# Patient Record
Sex: Female | Born: 1990 | Hispanic: Yes | Marital: Single | Smoking: Never smoker
Health system: Southern US, Community
[De-identification: ages and names within clinical notes are randomized; demographics above are authoritative.]

## PROBLEM LIST (undated history)

## (undated) DIAGNOSIS — E611 Iron deficiency: Secondary | ICD-10-CM

## (undated) DIAGNOSIS — J45909 Unspecified asthma, uncomplicated: Secondary | ICD-10-CM

## (undated) DIAGNOSIS — D689 Coagulation defect, unspecified: Secondary | ICD-10-CM

## (undated) HISTORY — DX: Iron deficiency: E61.1

## (undated) HISTORY — DX: Coagulation defect, unspecified: D68.9

## (undated) HISTORY — DX: Unspecified asthma, uncomplicated: J45.909

---

## 2012-04-18 ENCOUNTER — Emergency Department: Payer: Self-pay

## 2017-08-01 ENCOUNTER — Emergency Department
Admission: EM | Admit: 2017-08-01 | Discharge: 2017-08-02 | Disposition: A | Discharge status: Home / Self Care | Payer: BLUE CROSS/BLUE SHIELD | Attending: Emergency Medicine | Admitting: Emergency Medicine

## 2017-08-01 ENCOUNTER — Encounter: Payer: Self-pay | Admitting: Emergency Medicine

## 2017-08-01 ENCOUNTER — Emergency Department: Payer: BLUE CROSS/BLUE SHIELD

## 2017-08-01 DIAGNOSIS — R1031 Right lower quadrant pain: Secondary | ICD-10-CM

## 2017-08-01 LAB — CBC
HCT: 38.4 % (ref 35.0–47.0)
HEMOGLOBIN: 13.1 g/dL (ref 12.0–16.0)
MCH: 28.9 pg (ref 26.0–34.0)
MCHC: 34.1 g/dL (ref 32.0–36.0)
MCV: 84.8 fL (ref 80.0–100.0)
Platelets: 256 10*3/uL (ref 150–440)
RBC: 4.53 MIL/uL (ref 3.80–5.20)
RDW: 13.3 % (ref 11.5–14.5)
WBC: 7.9 10*3/uL (ref 3.6–11.0)

## 2017-08-01 LAB — COMPREHENSIVE METABOLIC PANEL
ALK PHOS: 50 U/L (ref 38–126)
ALT: 12 U/L — ABNORMAL LOW (ref 14–54)
AST: 20 U/L (ref 15–41)
Albumin: 4.1 g/dL (ref 3.5–5.0)
Anion gap: 7 (ref 5–15)
BUN: 13 mg/dL (ref 6–20)
CALCIUM: 8.9 mg/dL (ref 8.9–10.3)
CHLORIDE: 105 mmol/L (ref 101–111)
CO2: 22 mmol/L (ref 22–32)
Creatinine, Ser: 0.58 mg/dL (ref 0.44–1.00)
Glucose, Bld: 112 mg/dL — ABNORMAL HIGH (ref 65–99)
Potassium: 3.6 mmol/L (ref 3.5–5.1)
SODIUM: 134 mmol/L — AB (ref 135–145)
Total Bilirubin: 0.5 mg/dL (ref 0.3–1.2)
Total Protein: 7.3 g/dL (ref 6.5–8.1)

## 2017-08-01 LAB — URINALYSIS, COMPLETE (UACMP) WITH MICROSCOPIC
BILIRUBIN URINE: NEGATIVE
Bacteria, UA: NONE SEEN
GLUCOSE, UA: NEGATIVE mg/dL
HGB URINE DIPSTICK: NEGATIVE
KETONES UR: NEGATIVE mg/dL
LEUKOCYTES UA: NEGATIVE
Nitrite: NEGATIVE
PH: 6 (ref 5.0–8.0)
PROTEIN: NEGATIVE mg/dL
Specific Gravity, Urine: 1.019 (ref 1.005–1.030)

## 2017-08-01 LAB — POCT PREGNANCY, URINE: Preg Test, Ur: NEGATIVE

## 2017-08-01 LAB — LIPASE, BLOOD: LIPASE: 23 U/L (ref 11–51)

## 2017-08-01 NOTE — Discharge Instructions (Signed)
Fortunately today your blood work, your urine exam, and your ultrasound were all reassuring. Please make an appointment to follow-up with your primary care physician in 2 days for reexamination and return to the emergency department sooner for any new or worsening symptoms such as fevers, chills, worsening pain, or for any other concerns whatsoever.  It was a pleasure to take care of you today, and thank you for coming to our emergency department.  If you have any questions or concerns before leaving please ask the nurse to grab me and I'm more than happy to go through your aftercare instructions again.  If you were prescribed any opioid pain medication today such as Norco, Vicodin, Percocet, morphine, hydrocodone, or oxycodone please make sure you do not drive when you are taking this medication as it can alter your ability to drive safely.  If you have any concerns once you are home that you are not improving or are in fact getting worse before you can make it to your follow-up appointment, please do not hesitate to call 911 and come back for further evaluation.  Merrily Brittle, MD  Results for orders placed or performed during the hospital encounter of 08/01/17  Lipase, blood  Result Value Ref Range   Lipase 23 11 - 51 U/L  Comprehensive metabolic panel  Result Value Ref Range   Sodium 134 (L) 135 - 145 mmol/L   Potassium 3.6 3.5 - 5.1 mmol/L   Chloride 105 101 - 111 mmol/L   CO2 22 22 - 32 mmol/L   Glucose, Bld 112 (H) 65 - 99 mg/dL   BUN 13 6 - 20 mg/dL   Creatinine, Ser 4.09 0.44 - 1.00 mg/dL   Calcium 8.9 8.9 - 81.1 mg/dL   Total Protein 7.3 6.5 - 8.1 g/dL   Albumin 4.1 3.5 - 5.0 g/dL   AST 20 15 - 41 U/L   ALT 12 (L) 14 - 54 U/L   Alkaline Phosphatase 50 38 - 126 U/L   Total Bilirubin 0.5 0.3 - 1.2 mg/dL   GFR calc non Af Amer >60 >60 mL/min   GFR calc Af Amer >60 >60 mL/min   Anion gap 7 5 - 15  CBC  Result Value Ref Range   WBC 7.9 3.6 - 11.0 K/uL   RBC 4.53 3.80 - 5.20  MIL/uL   Hemoglobin 13.1 12.0 - 16.0 g/dL   HCT 91.4 78.2 - 95.6 %   MCV 84.8 80.0 - 100.0 fL   MCH 28.9 26.0 - 34.0 pg   MCHC 34.1 32.0 - 36.0 g/dL   RDW 21.3 08.6 - 57.8 %   Platelets 256 150 - 440 K/uL  Urinalysis, Complete w Microscopic  Result Value Ref Range   Color, Urine YELLOW (A) YELLOW   APPearance CLEAR (A) CLEAR   Specific Gravity, Urine 1.019 1.005 - 1.030   pH 6.0 5.0 - 8.0   Glucose, UA NEGATIVE NEGATIVE mg/dL   Hgb urine dipstick NEGATIVE NEGATIVE   Bilirubin Urine NEGATIVE NEGATIVE   Ketones, ur NEGATIVE NEGATIVE mg/dL   Protein, ur NEGATIVE NEGATIVE mg/dL   Nitrite NEGATIVE NEGATIVE   Leukocytes, UA NEGATIVE NEGATIVE   RBC / HPF 0-5 0 - 5 RBC/hpf   WBC, UA 0-5 0 - 5 WBC/hpf   Bacteria, UA NONE SEEN NONE SEEN   Squamous Epithelial / LPF 0-5 (A) NONE SEEN   Mucus PRESENT   Pregnancy, urine POC  Result Value Ref Range   Preg Test, Ur NEGATIVE NEGATIVE  US Transvaginal Non-ob  Result Date: 08/01/2017 CLINICAL DATA:  Right lower quadrant pain for 1 month EXAM: TRANSABDOMINAL AND TRANSVAGINAL ULTRASOUND OF PELVIS DOPPLER ULTRASOUND OF OVARIES TECHNIQUE: Both transabdominal and transvaginal ultrasound examinations of the pelvis were performed. Transabdominal technique was performed for global imaging of the pelvis including uterus, ovaries, adnexal regions, and pelvic cul-de-sac. It was necessary to proceed with endovaginal exam following the transabdominal exam to visualize the uterus, endometrium, ovaries and adnexa . Color and duplex Doppler ultrasound was utilized to evaluate blood flow to the ovaries. COMPARISON:  None. FINDINGS: Uterus Measurements: 6.6 x 3.5 x 4.7 cm. No fibroids or other mass visualized. Endometrium Thickness: 5 mm in thickness.  No focal abnormality visualized. Right ovary Measurements: 3.4 x 2.0 x 2.3 cm. Normal appearance/no adnexal mass. Left ovary Measurements: 3.0 x 1.8 x 2.2 cm. Normal appearance/no adnexal mass. Pulsed Doppler evaluation  of both ovaries demonstrates normal low-resistance arterial and venous waveforms. Other findings Small amount of free fluid in the pelvis. IMPRESSION: Unremarkable pelvic ultrasound. Electronically Signed   By: Charlett Nose M.D.   On: 08/01/2017 20:28   US Pelvis Complete  Result Date: 08/01/2017 CLINICAL DATA:  Right lower quadrant pain for 1 month EXAM: TRANSABDOMINAL AND TRANSVAGINAL ULTRASOUND OF PELVIS DOPPLER ULTRASOUND OF OVARIES TECHNIQUE: Both transabdominal and transvaginal ultrasound examinations of the pelvis were performed. Transabdominal technique was performed for global imaging of the pelvis including uterus, ovaries, adnexal regions, and pelvic cul-de-sac. It was necessary to proceed with endovaginal exam following the transabdominal exam to visualize the uterus, endometrium, ovaries and adnexa . Color and duplex Doppler ultrasound was utilized to evaluate blood flow to the ovaries. COMPARISON:  None. FINDINGS: Uterus Measurements: 6.6 x 3.5 x 4.7 cm. No fibroids or other mass visualized. Endometrium Thickness: 5 mm in thickness.  No focal abnormality visualized. Right ovary Measurements: 3.4 x 2.0 x 2.3 cm. Normal appearance/no adnexal mass. Left ovary Measurements: 3.0 x 1.8 x 2.2 cm. Normal appearance/no adnexal mass. Pulsed Doppler evaluation of both ovaries demonstrates normal low-resistance arterial and venous waveforms. Other findings Small amount of free fluid in the pelvis. IMPRESSION: Unremarkable pelvic ultrasound. Electronically Signed   By: Charlett Nose M.D.   On: 08/01/2017 20:28   [place] Art/ven Flow Abd Pelv Doppler  Result Date: 08/01/2017 CLINICAL DATA:  Right lower quadrant pain for 1 month EXAM: TRANSABDOMINAL AND TRANSVAGINAL ULTRASOUND OF PELVIS DOPPLER ULTRASOUND OF OVARIES TECHNIQUE: Both transabdominal and transvaginal ultrasound examinations of the pelvis were performed. Transabdominal technique was performed for global imaging of the pelvis including uterus, ovaries,  adnexal regions, and pelvic cul-de-sac. It was necessary to proceed with endovaginal exam following the transabdominal exam to visualize the uterus, endometrium, ovaries and adnexa . Color and duplex Doppler ultrasound was utilized to evaluate blood flow to the ovaries. COMPARISON:  None. FINDINGS: Uterus Measurements: 6.6 x 3.5 x 4.7 cm. No fibroids or other mass visualized. Endometrium Thickness: 5 mm in thickness.  No focal abnormality visualized. Right ovary Measurements: 3.4 x 2.0 x 2.3 cm. Normal appearance/no adnexal mass. Left ovary Measurements: 3.0 x 1.8 x 2.2 cm. Normal appearance/no adnexal mass. Pulsed Doppler evaluation of both ovaries demonstrates normal low-resistance arterial and venous waveforms. Other findings Small amount of free fluid in the pelvis. IMPRESSION: Unremarkable pelvic ultrasound. Electronically Signed   By: Charlett Nose M.D.   On: 08/01/2017 20:28

## 2017-08-01 NOTE — ED Triage Notes (Signed)
C/O RLQ and right flank pain x 3 weeks.  Seen by PCP for same about 2 weeks ago.

## 2017-08-01 NOTE — ED Provider Notes (Signed)
[facility] Emergency Department Provider Note  ____________________________________________   First MD Initiated Contact with Patient 08/01/17 1839     (approximate)  I have reviewed the triage vital signs and the nursing notes.   HISTORY  Chief Complaint Abdominal Pain    HPI Gabrielle Parrish is a 26 y.o. female who self presents to the emergency department with 3 weeks of intermittent right flank and right lower quadrant pain. The pain is moderate severity and today has been constant ever since 11 AM. 2 weeks ago she saw her primary care physician who felt the symptoms may be related to her period. Her last menstrual period was 8 days ago and has since resolved. She said no fevers or chills. No history of abdominal surgery. One week ago she had sexual intercourse and it was not particularly painful. She has noted no vaginal discharge or foul vaginal odor. She's had no nausea or vomiting.   History reviewed. No pertinent past medical history.  There are no active problems to display for this patient.   History reviewed. No pertinent surgical history.  Prior to Admission medications   Not on File    Allergies Patient has no known allergies.  No family history on file.  Social History Social History  Substance Use Topics  . Smoking status: Never Smoker  . Smokeless tobacco: Never Used  . Alcohol use Yes     Comment: occ    Review of Systems Constitutional: No fever/chills Eyes: No visual changes. ENT: No sore throat. Cardiovascular: Denies chest pain. Respiratory: Denies shortness of breath. Gastrointestinal: Positive abdominal pain.  No nausea, no vomiting.  No diarrhea.  No constipation. Genitourinary: Negative for dysuria. Musculoskeletal: Negative for back pain. Skin: Negative for rash. Neurological: Negative for headaches, focal weakness or numbness.   ____________________________________________   PHYSICAL EXAM:  VITAL  SIGNS: ED Triage Vitals  Enc Vitals Group     BP 08/01/17 1706 107/68     Pulse Rate 08/01/17 1706 69     Resp 08/01/17 1706 16     Temp 08/01/17 1706 97.7 F (36.5 C)     Temp Source 08/01/17 1706 Oral     SpO2 08/01/17 1706 99 %     Weight 08/01/17 1704 135 lb (61.2 kg)     Height 08/01/17 1704 5' (1.524 m)     Head Circumference --      Peak Flow --      Pain Score 08/01/17 1704 5     Pain Loc --      Pain Edu? --      Excl. in GC? --     Constitutional: Alert and oriented 4 well appearing nontoxic no diaphoresis but full clear sentences Eyes: PERRL EOMI. Head: Atraumatic. Nose: No congestion/rhinnorhea. Mouth/Throat: No trismus Neck: No stridor.   Cardiovascular: Normal rate, regular rhythm. Grossly normal heart sounds.  Good peripheral circulation. Respiratory: Normal respiratory effort.  No retractions. Lungs CTAB and moving good air Gastrointestinal: Soft nondistended nontender no rebound or guarding no peritonitis no McBurney's tenderness negative Rovsing's no costovertebral tenderness Musculoskeletal: No lower extremity edema   Neurologic:  Normal speech and language. No gross focal neurologic deficits are appreciated. Skin:  Skin is warm, dry and intact. No rash noted. Psychiatric: Mood and affect are normal. Speech and behavior are normal.    ____________________________________________   DIFFERENTIAL includes but not limited to  Pelvic inflammatory disease, urinary tract infection, pyelonephritis, appendicitis, diverticulitis ____________________________________________   LABS (all labs ordered are  listed, but only abnormal results are displayed)  Labs Reviewed  COMPREHENSIVE METABOLIC PANEL - Abnormal; Notable for the following:       Result Value   Sodium 134 (*)    Glucose, Bld 112 (*)    ALT 12 (*)    All other components within normal limits  URINALYSIS, COMPLETE (UACMP) WITH MICROSCOPIC - Abnormal; Notable for the following:    Color, Urine  YELLOW (*)    APPearance CLEAR (*)    Squamous Epithelial / LPF 0-5 (*)    All other components within normal limits  WET PREP, GENITAL  CHLAMYDIA/NGC RT PCR ([facility] ONLY)  LIPASE, BLOOD  CBC  POC URINE PREG, ED  POCT PREGNANCY, URINE    No signs of urinary tract infection __________________________________________  EKG   ____________________________________________  RADIOLOGY  Pelvic ultrasound normal ____________________________________________   PROCEDURES  Procedure(s) performed: no  Procedures  Critical Care performed: no  Observation: no ____________________________________________   INITIAL IMPRESSION / ASSESSMENT AND PLAN / ED COURSE  Pertinent labs & imaging results that were available during my care of the patient were reviewed by me and considered in my medical decision making (see chart for details).  The patient is having 3 weeks of right-sided abdominal pain. Her abdomen is benign in this duration of symptoms is not consistent with appendicitis. She has no hematuria and this is not consistent with renal colic. Pelvic ultrasound obtained and she has no evidence of tubo-ovarian abscess etc. She defers pelvic exam at this time so we will check GC CT in her urine. At this point her abdomen is benign and her vitals are stable and she is able to eat and drink. She is medically stable for outpatient management.      ____________________________________________   FINAL CLINICAL IMPRESSION(S) / ED DIAGNOSES  Final diagnoses:  Right lower quadrant abdominal pain      NEW MEDICATIONS STARTED DURING THIS VISIT:  New Prescriptions   No medications on file     Note:  This document was prepared using Dragon voice recognition software and may include unintentional dictation errors.     Merrily Brittle, MD 08/02/17 Perlie Mayo

## 2017-08-02 LAB — CHLAMYDIA/NGC RT PCR (ARMC ONLY)
Chlamydia Tr: NOT DETECTED
N gonorrhoeae: NOT DETECTED

## 2017-11-23 ENCOUNTER — Ambulatory Visit
Admission: EM | Admit: 2017-11-23 | Discharge: 2017-11-23 | Disposition: A | Discharge status: Home / Self Care | Payer: BLUE CROSS/BLUE SHIELD

## 2017-11-23 ENCOUNTER — Encounter: Payer: Self-pay | Admitting: *Deleted

## 2017-11-23 DIAGNOSIS — R600 Localized edema: Secondary | ICD-10-CM | POA: Diagnosis not present

## 2017-11-23 DIAGNOSIS — M79605 Pain in left leg: Secondary | ICD-10-CM

## 2017-11-23 NOTE — Discharge Instructions (Signed)
-  recommend you go to the ER for US evaluation for a blood clot in your leg. It is best to have this evaluated as soon as possible because clots in the leg can break off and travel through your heart and cause heart/respiratory problems or stroke.

## 2017-11-23 NOTE — ED Provider Notes (Signed)
[facility]    CSN: [id number] Arrival date & time: 11/23/17  1842     History   Chief Complaint Chief Complaint  Patient presents with  . Leg Pain    HPI Gabrielle Parrish is a 26 y.o. female.   Patient is a [age] show female who presents with complaint of swelling to her left leg. Patient's couple weeks ago she was sitting on the floor of the dog with her left foot tucked up against her right thigh, leaving her left hip flexed out. States that she had pain for a day or so after that that pain resolved. The point that she just cannot pulled something sitting on the floor. She states that her pain and discomfort returned this past Tuesday and that she elevated her leg last night. This morning, she noticed that it was swelling. She reports feeling little tingly states that with her legs dangling while sitting on the exam table, her lower leg feels heavy. She has been able to walk on it. She has not had any recent travels but does take a oral birth control pill daily. She denies any chest pain or shortness of breath.     History reviewed. No pertinent past medical history.  There are no active problems to display for this patient.   History reviewed. No pertinent surgical history.  OB History    No data available       Home Medications    Prior to Admission medications   Medication Sig Start Date End Date Taking? Authorizing Provider  naproxen (NAPROSYN) 500 MG tablet Take 500 mg by mouth 2 (two) times daily with a meal.   Yes [provider]  Norgestim-Eth Estrad Triphasic (TRI-SPRINTEC PO) Take by mouth.   Yes [provider]  traMADol (ULTRAM) 50 MG tablet Take by mouth every 6 (six) hours as needed.   Yes [provider]    Family History Family History  Problem Relation Age of Onset  . Healthy Mother   . Healthy Father     Social History Social History   Tobacco Use  . Smoking status: Never Smoker  . Smokeless tobacco:  Never Used  Substance Use Topics  . Alcohol use: Yes    Comment: occ  . Drug use: No     Allergies   Patient has no known allergies.   Review of Systems Review of Systems  As noted above in history of present illness. Other systems reviewed and found to be negative.   Physical Exam Triage Vital Signs ED Triage Vitals  Enc Vitals Group     BP 11/23/17 1858 113/72     Pulse Rate 11/23/17 1858 98     Resp 11/23/17 1858 16     Temp 11/23/17 1858 98.3 F (36.8 C)     Temp Source 11/23/17 1858 Oral     SpO2 11/23/17 1858 99 %     Weight 11/23/17 1900 135 lb (61.2 kg)     Height 11/23/17 1900 5\' 8"  (1.727 m)     Head Circumference --      Peak Flow --      Pain Score 11/23/17 1901 7     Pain Loc --      Pain Edu? --      Excl. in GC? --    No data found.  Updated Vital Signs BP 113/72 (BP Location: Left Arm)   Pulse 98   Temp 98.3 F (36.8 C) (Oral)   Resp 16  Ht 5\' 8"  (1.727 m)   Wt 135 lb (61.2 kg)   LMP 11/17/2017 (Exact Date)   SpO2 99%   BMI 20.53 kg/m    Physical Exam  Constitutional: She appears well-developed and well-nourished.  Eyes: EOM are normal.  Neck: Normal range of motion.  Cardiovascular: Normal rate, regular rhythm and normal heart sounds.  Pulmonary/Chest: Effort normal and breath sounds normal. No respiratory distress.  Musculoskeletal: Normal range of motion.  Left lower extremity with increased edema compared to the right., Both in overall feels tightness compared to the right plus pitting edema noted to the left shin and ankle. Positive Homans sign on the left.  Skin: Skin is warm and dry.     UC Treatments / Results  Labs (all labs ordered are listed, but only abnormal results are displayed) Labs Reviewed - No data to display  EKG  EKG Interpretation None       Radiology No results found.  Procedures Procedures (including critical care time)  Medications Ordered in UC Medications - No data to display   Initial  Impression / Assessment and Plan / UC Course  I have reviewed the triage vital signs and the nursing notes.  Pertinent labs & imaging results that were available during my care of the patient were reviewed by me and considered in my medical decision making (see chart for details).    Patient with increased swelling to the left leg compared to the right inguinal for a couple days now. Both increased tightness as well as edema noted to the leg. Positive Homans sign on the left.  Final Clinical Impressions(s) / UC Diagnoses   Final diagnoses:  Leg edema, left    ED Discharge Orders    None     Concern for a DVT on the left given her oral contraceptive use. 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 Recommended she go to the ER to have further evaluation with an ultrasound as renal have that capability here at this time. She is in agreement. Her questions were answered and she states that she will go on to have exam done.    Controlled Substance Prescriptions [place] Controlled Substance Registry consulted? Not Applicable   Candis SchatzHarris, Michael D, PA-C 11/23/17 [id number]

## 2017-11-23 NOTE — ED Triage Notes (Signed)
2 weeks ago pt was playing with her dog on the floor and had her left leg in a position extended laterally. When she got up she began to have pain in her left medial thigh. Pain has been intermittent for the past 2 weeks. Left leg became edematous and painful today. Pain is worse with weight bearing.

## 2017-11-24 MED ORDER — TRAMADOL HCL 50 MG PO TABS
50.00 mg | ORAL_TABLET | ORAL | Status: DC
Start: ? — End: 2017-11-24

## 2017-11-24 MED ORDER — ACETAMINOPHEN 325 MG PO TABS
650.00 mg | ORAL_TABLET | ORAL | Status: DC
Start: ? — End: 2017-11-24

## 2017-11-24 MED ORDER — RIVAROXABAN 15 MG PO TABS
15.00 mg | ORAL_TABLET | ORAL | Status: DC
Start: 2017-11-24 — End: 2017-11-24

## 2017-12-07 ENCOUNTER — Other Ambulatory Visit (INDEPENDENT_AMBULATORY_CARE_PROVIDER_SITE_OTHER): Payer: Self-pay

## 2017-12-07 ENCOUNTER — Ambulatory Visit (INDEPENDENT_AMBULATORY_CARE_PROVIDER_SITE_OTHER): Payer: BLUE CROSS/BLUE SHIELD | Admitting: Vascular Surgery

## 2017-12-07 ENCOUNTER — Encounter (INDEPENDENT_AMBULATORY_CARE_PROVIDER_SITE_OTHER): Payer: Self-pay | Admitting: Vascular Surgery

## 2017-12-07 DIAGNOSIS — J45909 Unspecified asthma, uncomplicated: Secondary | ICD-10-CM

## 2017-12-07 DIAGNOSIS — I2699 Other pulmonary embolism without acute cor pulmonale: Secondary | ICD-10-CM

## 2017-12-07 DIAGNOSIS — I82422 Acute embolism and thrombosis of left iliac vein: Secondary | ICD-10-CM

## 2017-12-07 DIAGNOSIS — J45998 Other asthma: Secondary | ICD-10-CM

## 2017-12-07 MED ORDER — CEFAZOLIN SODIUM-DEXTROSE 1-4 GM/50ML-% IV SOLN
1.0000 g | Freq: Once | INTRAVENOUS | Status: AC
  Administered 2017-12-08: 1 g via INTRAVENOUS

## 2017-12-08 ENCOUNTER — Encounter
Admission: RE | Disposition: A | Discharge status: Home / Self Care | Payer: Self-pay | Source: Ambulatory Visit | Attending: Vascular Surgery

## 2017-12-08 ENCOUNTER — Other Ambulatory Visit: Payer: Self-pay

## 2017-12-08 ENCOUNTER — Encounter (INDEPENDENT_AMBULATORY_CARE_PROVIDER_SITE_OTHER): Payer: Self-pay | Admitting: Vascular Surgery

## 2017-12-08 ENCOUNTER — Observation Stay
Admission: RE | Admit: 2017-12-08 | Discharge: 2017-12-09 | Disposition: A | Discharge status: Home / Self Care | Payer: BLUE CROSS/BLUE SHIELD | Source: Ambulatory Visit | Attending: Vascular Surgery | Admitting: Vascular Surgery

## 2017-12-08 DIAGNOSIS — I2699 Other pulmonary embolism without acute cor pulmonale: Secondary | ICD-10-CM | POA: Insufficient documentation

## 2017-12-08 DIAGNOSIS — J45998 Other asthma: Secondary | ICD-10-CM | POA: Insufficient documentation

## 2017-12-08 DIAGNOSIS — I82422 Acute embolism and thrombosis of left iliac vein: Principal | ICD-10-CM | POA: Insufficient documentation

## 2017-12-08 DIAGNOSIS — I82402 Acute embolism and thrombosis of unspecified deep veins of left lower extremity: Secondary | ICD-10-CM | POA: Diagnosis not present

## 2017-12-08 DIAGNOSIS — Z79899 Other long term (current) drug therapy: Secondary | ICD-10-CM | POA: Insufficient documentation

## 2017-12-08 DIAGNOSIS — J45909 Unspecified asthma, uncomplicated: Secondary | ICD-10-CM | POA: Insufficient documentation

## 2017-12-08 DIAGNOSIS — I82409 Acute embolism and thrombosis of unspecified deep veins of unspecified lower extremity: Secondary | ICD-10-CM | POA: Insufficient documentation

## 2017-12-08 HISTORY — PX: IVC FILTER INSERTION: CATH118245

## 2017-12-08 HISTORY — PX: PERIPHERAL VASCULAR THROMBECTOMY: CATH118306

## 2017-12-08 LAB — PREGNANCY, URINE: PREG TEST UR: NEGATIVE

## 2017-12-08 LAB — BASIC METABOLIC PANEL
Anion gap: 9 (ref 5–15)
BUN: 10 mg/dL (ref 6–20)
CHLORIDE: 107 mmol/L (ref 101–111)
CO2: 23 mmol/L (ref 22–32)
CREATININE: 0.57 mg/dL (ref 0.44–1.00)
Calcium: 9 mg/dL (ref 8.9–10.3)
Glucose, Bld: 90 mg/dL (ref 65–99)
POTASSIUM: 4.1 mmol/L (ref 3.5–5.1)
SODIUM: 139 mmol/L (ref 135–145)

## 2017-12-08 LAB — HEPARIN LEVEL (UNFRACTIONATED): HEPARIN UNFRACTIONATED: 0.29 [IU]/mL — AB (ref 0.30–0.70)

## 2017-12-08 LAB — APTT: APTT: 44 s — AB (ref 24–36)

## 2017-12-08 SURGERY — PERIPHERAL VASCULAR THROMBECTOMY
Anesthesia: Moderate Sedation | Laterality: Left

## 2017-12-08 MED ORDER — MORPHINE SULFATE (PF) 4 MG/ML IV SOLN
2.0000 mg | INTRAVENOUS | Status: DC | PRN
  Administered 2017-12-09: 2 mg via INTRAVENOUS
  Filled 2017-12-08 (×2): qty 1

## 2017-12-08 MED ORDER — LIDOCAINE HCL (PF) 1 % IJ SOLN
INTRAMUSCULAR | Status: AC
  Filled 2017-12-08: qty 30

## 2017-12-08 MED ORDER — FENTANYL CITRATE (PF) 100 MCG/2ML IJ SOLN
INTRAMUSCULAR | Status: DC | PRN
  Administered 2017-12-08 (×6): 25 ug via INTRAVENOUS
  Administered 2017-12-08: 50 ug via INTRAVENOUS
  Administered 2017-12-08 (×3): 25 ug via INTRAVENOUS

## 2017-12-08 MED ORDER — HEPARIN SODIUM (PORCINE) 1000 UNIT/ML IJ SOLN
INTRAMUSCULAR | Status: AC
  Filled 2017-12-08: qty 1

## 2017-12-08 MED ORDER — OXYCODONE HCL 5 MG PO TABS
ORAL_TABLET | ORAL | Status: AC
  Filled 2017-12-08: qty 1

## 2017-12-08 MED ORDER — MIDAZOLAM HCL 2 MG/2ML IJ SOLN
INTRAMUSCULAR | Status: DC | PRN
  Administered 2017-12-08: 0.5 mg via INTRAVENOUS
  Administered 2017-12-08: 1 mg via INTRAVENOUS
  Administered 2017-12-08 (×3): 0.5 mg via INTRAVENOUS
  Administered 2017-12-08: 2 mg via INTRAVENOUS
  Administered 2017-12-08 (×3): 0.5 mg via INTRAVENOUS

## 2017-12-08 MED ORDER — MIDAZOLAM HCL 5 MG/5ML IJ SOLN
INTRAMUSCULAR | Status: AC
  Filled 2017-12-08: qty 5

## 2017-12-08 MED ORDER — ALBUTEROL SULFATE (2.5 MG/3ML) 0.083% IN NEBU: 2.5000 mg | INHALATION_SOLUTION | Freq: Four times a day (QID) | RESPIRATORY_TRACT | Status: DC | PRN

## 2017-12-08 MED ORDER — ALTEPLASE 2 MG IJ SOLR
INTRAMUSCULAR | Status: DC | PRN
  Administered 2017-12-08: 12 mg

## 2017-12-08 MED ORDER — HEPARIN (PORCINE) IN NACL 100-0.45 UNIT/ML-% IJ SOLN
650.0000 [IU]/h | INTRAMUSCULAR | Status: AC
  Administered 2017-12-08: 650 [IU]/h via INTRAVENOUS

## 2017-12-08 MED ORDER — SODIUM CHLORIDE 0.9% FLUSH: 3.0000 mL | INTRAVENOUS | Status: DC | PRN

## 2017-12-08 MED ORDER — SODIUM CHLORIDE 0.9% FLUSH
3.0000 mL | Freq: Two times a day (BID) | INTRAVENOUS | Status: DC
  Administered 2017-12-09: 3 mL via INTRAVENOUS

## 2017-12-08 MED ORDER — MIDAZOLAM HCL 5 MG/5ML IJ SOLN
INTRAMUSCULAR | Status: AC
Start: 2017-12-08 — End: 2017-12-08
  Filled 2017-12-08: qty 5

## 2017-12-08 MED ORDER — IOPAMIDOL (ISOVUE-300) INJECTION 61%
INTRAVENOUS | Status: DC | PRN
  Administered 2017-12-08: 60 mL via INTRAVENOUS

## 2017-12-08 MED ORDER — ALTEPLASE 2 MG IJ SOLR
INTRAMUSCULAR | Status: AC
  Filled 2017-12-08: qty 12

## 2017-12-08 MED ORDER — OXYCODONE HCL 5 MG PO TABS
5.0000 mg | ORAL_TABLET | ORAL | Status: DC | PRN
  Administered 2017-12-08: 5 mg via ORAL

## 2017-12-08 MED ORDER — FENTANYL CITRATE (PF) 100 MCG/2ML IJ SOLN
INTRAMUSCULAR | Status: AC
  Filled 2017-12-08: qty 2

## 2017-12-08 MED ORDER — HEPARIN SODIUM (PORCINE) 1000 UNIT/ML IJ SOLN
INTRAMUSCULAR | Status: DC | PRN
  Administered 2017-12-08: 3000 [IU] via INTRAVENOUS
  Administered 2017-12-08: 2000 [IU] via INTRAVENOUS

## 2017-12-08 MED ORDER — ACETAMINOPHEN 500 MG PO TABS: 250.0000 mg | ORAL_TABLET | Freq: Four times a day (QID) | ORAL | Status: DC | PRN

## 2017-12-08 MED ORDER — ONDANSETRON HCL 4 MG/2ML IJ SOLN
4.0000 mg | Freq: Four times a day (QID) | INTRAMUSCULAR | Status: DC | PRN
  Administered 2017-12-08: 4 mg via INTRAVENOUS
  Filled 2017-12-08: qty 2

## 2017-12-08 MED ORDER — SODIUM CHLORIDE 0.9 % IV SOLN
INTRAVENOUS | Status: DC
  Administered 2017-12-08: 12:00:00 via INTRAVENOUS

## 2017-12-08 MED ORDER — HEPARIN (PORCINE) IN NACL 2-0.9 UNIT/ML-% IJ SOLN
INTRAMUSCULAR | Status: AC
  Filled 2017-12-08: qty 1000

## 2017-12-08 MED ORDER — HEPARIN (PORCINE) IN NACL 100-0.45 UNIT/ML-% IJ SOLN
INTRAMUSCULAR | Status: AC
  Administered 2017-12-08: 650 [IU]/h via INTRAVENOUS
  Filled 2017-12-08: qty 250

## 2017-12-08 MED ORDER — SODIUM CHLORIDE 0.9 % IV SOLN: 250.0000 mL | INTRAVENOUS | Status: DC | PRN

## 2017-12-08 MED ORDER — RIVAROXABAN 15 MG PO TABS
15.0000 mg | ORAL_TABLET | Freq: Two times a day (BID) | ORAL | Status: DC
  Administered 2017-12-09: 15 mg via ORAL
  Filled 2017-12-08 (×2): qty 1

## 2017-12-08 MED ORDER — SODIUM CHLORIDE 0.9 % IV SOLN
INTRAVENOUS | Status: AC
  Administered 2017-12-08: 16:00:00 via INTRAVENOUS

## 2017-12-08 MED ORDER — FENTANYL CITRATE (PF) 100 MCG/2ML IJ SOLN
INTRAMUSCULAR | Status: AC
Start: 2017-12-08 — End: 2017-12-08
  Filled 2017-12-08: qty 2

## 2017-12-08 SURGICAL SUPPLY — 30 items
BALLN ARMADA 14X60X80 (BALLOONS) ×3
BALLN DORADO 10X80X80 (BALLOONS) ×3
BALLN DORADO 8X60X80 (BALLOONS) ×3
BALLN LUTONIX AV 12X40X75 (BALLOONS) ×6
BALLN ULTRVRSE 10X40X75 (BALLOONS) ×3
BALLOON ARMADA 14X60X80 (BALLOONS) ×1 IMPLANT
BALLOON DORADO 10X80X80 (BALLOONS) ×1 IMPLANT
BALLOON DORADO 8X60X80 (BALLOONS) ×1 IMPLANT
BALLOON LUTONIX AV 12X40X75 (BALLOONS) ×2 IMPLANT
BALLOON ULTRVRSE 10X40X75 (BALLOONS) ×1 IMPLANT
CANISTER PENUMBRA MAX (MISCELLANEOUS) ×3 IMPLANT
CATH BEACON 5 .035 65 KMP TIP (CATHETERS) ×3 IMPLANT
CATH INDIGO 8 TORQ TIP 85CM (CATHETERS) ×3 IMPLANT
CATH INDIGO SEP 8 (CATHETERS) ×3 IMPLANT
DEVICE PRESTO INFLATION (MISCELLANEOUS) ×3 IMPLANT
DEVICE TORQUE (MISCELLANEOUS) ×3 IMPLANT
FILTER VC CELECT-FEMORAL (Filter) ×3 IMPLANT
GUIDEWIRE ANGLED .035 180CM (WIRE) ×3 IMPLANT
GUIDEWIRE SUPER STIFF .035X180 (WIRE) ×3 IMPLANT
KIT FEMORAL DEL DENALI (Miscellaneous) IMPLANT
NEEDLE ENTRY 21GA 7CM ECHOTIP (NEEDLE) ×3 IMPLANT
PACK ANGIOGRAPHY (CUSTOM PROCEDURE TRAY) ×6 IMPLANT
SET INTRO CAPELLA COAXIAL (SET/KITS/TRAYS/PACK) ×3 IMPLANT
SHEATH BRITE TIP 8FRX11 (SHEATH) ×3 IMPLANT
SHIELD X-DRAPE GOLD 12X17 (MISCELLANEOUS) ×3 IMPLANT
STENT LIFESTAR 14X40 (Permanent Stent) ×6 IMPLANT
TUBING ASPIRATION INDIGO (MISCELLANEOUS) ×3 IMPLANT
WIRE J 3MM .035X145CM (WIRE) ×3 IMPLANT
WIRE MAGIC TOR.035 180C (WIRE) ×3 IMPLANT
WIRE SPARTACORE .014X190CM (WIRE) ×3 IMPLANT

## 2017-12-08 NOTE — Progress Notes (Signed)
MRN : [id number]  Gabrielle Parrish is a 27 y.o. (04-15-1991) female who presents with chief complaint of  Chief Complaint  Patient presents with  . New Patient (Initial Visit)    Left leg swelling and pain  .  History of Present Illness: The patient is seen for second opinion.  On November 24, 2017 she was seen at [facility] emergency room and noted to have iliofemoral DVT as well as pulmonary emboli.  At that time she was started on Xarelto and discharged.  Apparently, she was not offered thrombolysis at that time.    Since that time she has noted improvement in her shortness of breath and her pleuritic type chest pains have gone away.  However, her left leg continues to be a significant problem.  She states it continues to be very painful.  She has to walk with a limp because of the swelling and pain.  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  She does note that when she is able to elevate her left leg does feel significantly better.  She also notes that in the morning her leg is better.  She has been faithfully taking her Xarelto.  As a added factor she was given a prescription for a new oral contraceptive but has not filled that and recently had unprotected sex.  She is asking whether a Plan B pill is appropriate given her Xarelto and her DVT.  Current Meds  Medication Sig  . Rivaroxaban (XARELTO) 15 MG TABS tablet Take 15 mg by mouth 2 (two) times daily with a meal.   . [DISCONTINUED] albuterol (PROAIR HFA) 108 (90 Base) MCG/ACT inhaler INHALE 2 PUFF EVERY 4 - 6 HOURS AS NEEDED  . [DISCONTINUED] naproxen (NAPROSYN) 500 MG tablet Take 500 mg by mouth 2 (two) times daily with a meal.  . [DISCONTINUED] norethindrone (MICRONOR,CAMILA,ERRIN) 0.35 MG tablet Take 1 tablet by mouth daily.    Past Medical History:  Diagnosis Date  . Asthma     History reviewed. No pertinent surgical history.  Social  History Social History   Tobacco Use  . Smoking status: Never Smoker  . Smokeless tobacco: Never Used  Substance Use Topics  . Alcohol use: Yes    Comment: occ  . Drug use: No    Family History Family History  Problem Relation Age of Onset  . Healthy Mother   . Healthy Father   No family history of bleeding/clotting disorders, porphyria or autoimmune disease   No Known Allergies   REVIEW OF SYSTEMS (Negative unless checked)  Constitutional: [] Weight loss  [] Fever  [] Chills Cardiac: [] Chest pain   [] Chest pressure   [] Palpitations   [] Shortness of breath when laying flat   [] Shortness of breath with exertion. Vascular:  [] Pain in legs with walking   [x] Pain in legs at rest  [x] History of DVT   [x] Phlebitis   [] Swelling in legs   [] Varicose veins   [] Non-healing ulcers Pulmonary:   [] Uses home oxygen   [] Productive cough   [] Hemoptysis   [] Wheeze  [] COPD   [] Asthma Neurologic:  [] Dizziness   [] Seizures   [] History of stroke   [] History of TIA  [] Aphasia   [] Vissual changes   [] Weakness or numbness in arm   [] Weakness or numbness in leg Musculoskeletal:   [] Joint swelling   [] Joint pain   [] Low back pain Hematologic:  [] Easy bruising  [] Easy bleeding   [] Hypercoagulable state   []   Anemic Gastrointestinal:  [] Diarrhea   [] Vomiting  [] Gastroesophageal reflux/heartburn   [] Difficulty swallowing. Genitourinary:  [] Chronic kidney disease   [] Difficult urination  [] Frequent urination   [] Blood in urine Skin:  [] Rashes   [] Ulcers  Psychological:  [] History of anxiety   []  History of major depression.  Physical Examination  Vitals:   12/07/17 1137  BP: 98/62  Pulse: 66  Resp: 15  Weight: 138 lb (62.6 kg)  Height: 5' (1.524 m)   Body mass index is 26.95 kg/m. Gen: WD/WN, NAD Head: [place]/AT, No temporalis wasting.  Ear/Nose/Throat: Hearing grossly intact, nares w/o erythema or drainage, poor dentition Eyes: PER, EOMI, sclera nonicteric.  Neck: Supple, no masses.  No bruit or JVD.   Pulmonary:  Good air movement, clear to auscultation bilaterally, no use of accessory muscles.  Cardiac: RRR, normal S1, S2, no Murmurs. Vascular: Left leg is severely swollen and congested. Vessel Right Left  Radial Palpable Palpable  PT Palpable Palpable  DP Palpable Palpable  Gastrointestinal: soft, non-distended. No guarding/no peritoneal signs.  Musculoskeletal: M/S 5/5 throughout.  No deformity or atrophy.  Neurologic: CN 2-12 intact. Pain and light touch intact in extremities.  Symmetrical.  Speech is fluent. Motor exam as listed above. Psychiatric: Judgment intact, Mood & affect appropriate for pt's clinical situation. Dermatologic: No rashes or ulcers noted.  No changes consistent with cellulitis. Lymph : No Cervical lymphadenopathy, no lichenification or skin changes of chronic lymphedema.  CBC Lab Results  Component Value Date   WBC 7.9 08/01/2017   HGB 13.1 08/01/2017   HCT 38.4 08/01/2017   MCV 84.8 08/01/2017   PLT 256 08/01/2017    BMET    Component Value Date/Time   NA 134 (L) 08/01/2017 1705   K 3.6 08/01/2017 1705   CL 105 08/01/2017 1705   CO2 22 08/01/2017 1705   GLUCOSE 112 (H) 08/01/2017 1705   BUN 13 08/01/2017 1705   CREATININE 0.58 08/01/2017 1705   CALCIUM 8.9 08/01/2017 1705   GFRNONAA >60 08/01/2017 1705   GFRAA >60 08/01/2017 1705   CrCl cannot be calculated (Patient's most recent lab result is older than the maximum 21 days allowed.).  COAG No results found for: INR, PROTIME  Radiology No results found.   Assessment/Plan 1. Acute deep vein thrombosis (DVT) of iliac vein of left lower extremity (HCC) Recommend:   Given the patient's significant disability and complaints about lifestyle limitation her difficulties at work I have recommended thrombolysis.  The timeframe was discussed with the patient.  It has been several weeks but I believe that given we are not past the 3-week window the procedure can still be successful.  I have  described to May Thurner to her and the possibility of stenting of the left iliac vein.  I described thrombolysis.  I have also reviewed the insertion of a IVC filter prior to beginning the thrombolysis and have already discussed with her that this filter must be removed in approximately 4-6 weeks.  The risks and benefits were reviewed in detail alternative therapies have been discussed extensively all questions been answered.  Initially the patient wished to discuss this with family.  She called back later in the day and requested that we move forward.  Intervention will be arranged for tomorrow Friday, December 08, 2017.  With respect to her concern regarding the Plan B pill with I have contacted Dr. Jean Rosenthal and reviewed this situation.  Given that she is continuing her Xarelto there should not be any complicating factors with  taking the pill.  We also agreed that given her new comorbidities establishing with an OB/GYN would be in her best interest and she is agreed to see Dr. Jean RosenthalJackson as an outpatient.  I will place a referral. Patient's duplex ultrasound of the venous system shows DVT from the popliteal to the femoral veins.  Elevation was stressed, use of a recliner was discussed.  I have had a long discussion with the patient regarding DVT and post phlebitic changes such as swelling and why it  causes symptoms such as pain.  The patient will wear graduated compression stockings class 1 (20-30 mmHg), beginning after three full days of anticoagulation, on a daily basis a prescription was given. The patient will  beginning wearing the stockings first thing in the morning and removing them in the evening. The patient is instructed specifically not to sleep in the stockings.  In addition, behavioral modification including elevation during the day and avoidance of prolonged dependency will be initiated.    The patient will continue anticoagulation for now as there have not been any problems or complications  at this point.    A total of 70 minutes was spent with this patient and greater than 50% was spent in counseling and coordination of care with the patient.  Discussion included the treatment options for vascular disease including indications for surgery and intervention.  Also discussed is the appropriate timing of treatment.  In addition medical therapy was discussed.   2. Other acute pulmonary embolism without acute cor pulmonale (HCC) See above  3. Mild asthma, unspecified whether complicated, unspecified whether persistent Continue pulmonary medications and aerosols as already ordered, these medications have been reviewed and there are no changes at this time.      Levora DredgeGregory Era Parr, MD  12/08/2017 8:06 AM

## 2017-12-08 NOTE — Op Note (Signed)
VEIN AND VASCULAR SURGERY                                                                               OPERATIVE NOTE    PRE-OPERATIVE DIAGNOSIS: Symptomatic left leg DVT; pulmonary embolism;   POST-OPERATIVE DIAGNOSIS: Same; May Thurner syndrome  PROCEDURE: 1. Ultrasound guidance for vascular access to the left popliteal vein 2. Catheter placement into the inferior vena cava for placement of IVC filter 3. Inferior venacavogram 4. Placement of a Celect IVC filter infrarenal 5. Introduction catheter into venous system second order catheter placement left popliteal approach 6. Infusion thrombolysis with 12 mg of TPA 7. Mechanical thrombectomy of the left common femoral, external iliac and common iliac vein using the penumbra cat 8 catheter 8.    Percutaneous transluminal angioplasty and stent placement left common and external iliac veins 9.    Percutaneous transluminal angioplasty left common femoral vein  SURGEON: Hortencia Pilar  ASSISTANT(S): None  ANESTHESIA: Conscious sedation was administered by the interventional radiology RN under my direct supervision. IV Versed plus fentanyl were utilized. Continuous ECG, pulse oximetry and blood pressure was monitored throughout the entire procedure.  Conscious sedation was administered for a total of 138 minutes.  ESTIMATED BLOOD LOSS: minimal  Contrast:60  Fluoroscopy time:17.5  FINDING(S): 1. Patent IVC; thrombus within the left common femoral external iliac and common iliac veins, stricture of the proximal external iliac vein and distal common iliac vein  SPECIMEN(S): none  INDICATIONS:  Gabrielle Parrish is a 27 y.o. year old female who presents with massive swelling of the left leg quite painful in association with pleuritic chest pains and hypoxia as well as shortness of breath. Inferior vena cava filter is indicated for this  reason. Risks and benefits including filter thrombosis, migration, fracture, bleeding, and infection were all discussed. We discussed that all IVC filters that we place can be removed if desired from the patient once the need for the filter has passed.   DESCRIPTION: After obtaining full informed written consent, the patient was brought back to the vascular suite. The skin was sterilely prepped and draped in a sterile surgical field was created.  The patient is positioned prone.  Ultrasound was placed in a sterile sleeve.  The left popliteal vein was image with the ultrasound.  It was echolucent and compressible indicating patency.  Image was recorded for the permanent record. The left popliteal vein was accessed under direct ultrasound guidance without difficulty with a micropuncture needle and a microwire was advanced without difficulty.   A microsheath was then inserted and then a J-wire was then placed. The dilator is passed over the wire and the delivery sheath was placed into the inferior vena cava.  The delivery sheath was negotiated over a floppy Glidewire.  Inferior venacavogram was performed. This demonstrated a patent IVC with  the level of the renal veins at L1-L2. The filter was then deployed into the inferior vena cava at the level of inferior margin of L2 just below the renal veins. The delivery sheath was then exchanged for an 8 French 11 cm Pinnacle sheath.  KMP catheter together with the Magic torque wire then advanced up to the level of the common iliac and hand injection contrast is utilized to demonstrate that the common iliac vein on the left is occluded up to the inferior vena cava.  The inferior vena cava is known to be patent from the above filter placement. Catheter is then repositioned to the common femoral level and hand injection contrast demonstrates that there is a and occlusive thrombus within the common femoral extending into the iliac veins.  The popliteal and superficial  femoral vein appear to be patent  Penumbra cat 6 catheter is then prepped on the field and 12 mg of TPA is reconstituted 30 cc. This is then laced throughout the common femoral and iliac veins. The TPA is then allowed to dwell. Subsequently, the penumbra cat 6 catheter is engaged in the aspiration mode and aspiration of the common femoral as well as the external iliac and common iliac veins is performed multiple passes are made with the volumes recorded in the procedure notes.  Follow-up imaging now demonstrates that a large portion of the thrombus is been eliminated from the proximal femoral and iliac veins. There is clearly an abnormality of the iliac veins such as a stricture or stenosis. Within the common femoral vein there is a high-grade residual narrowing and therefore initially a 10 x 6 balloon was inflated to 6 atm for 1 minute and subsequently a 12 x 8 balloon is inflated to 16 atm for 1 minute. Follow-up imaging demonstrates there is some residual thrombus but a marked improvement and therefore the penumbra is advanced to this level and multiple short passes are made through this region. After this maneuver follow-up imaging demonstrates near total resolution of the thrombus within the common femoral vein. Iliac veins are improved as well and are magnified image of the IVC and filter demonstrates that the IVC and the filter remained widely patent with no evidence of thrombus within the filter.   Very well-defined and appears to encompass the proximal half of the left external iliac vein in the distal half of the left common femoral vein.  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  Given that the residual stenosis remains 90+ percent I elected to place stents across this lesion.  Two 14 x 40 life star stents were deployed overlapping slightly and then postdilated with a 12 x 40 Lutonix balloon.  2 Lutonix balloons were required inflations were to 12 atm for 1 full minute.  Follow-up imaging now  demonstrated rapid flow of contrast through the iliac system with minimal residual stenosis, less than 5%.  The stricture is now given that the patient now is widely patent with minimal residual thrombus from the distal popliteal to the IVC the procedure is terminated. The wires removed the sheath is removed pressures held and a pressure dressing with Coban and is then applied. The patient is then returned to the supine position  Interpretation: Initial images demonstrated normal vena cava, filter is placed without difficulty. Initial images the left lower extremity then demonstrated extensive thrombus throughout the common femoral, external iliac and common iliac veins Following intervention described above there is near-total resolution of thrombus throughout.  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  Follow-up imaging now demonstrated rapid flow of contrast through the iliac system with minimal residual stenosis, less than 5%.     COMPLICATIONS: None  CONDITION: Stable  Hortencia Pilar  12/08/2017,2:30 PM

## 2017-12-08 NOTE — OR Nursing (Signed)
Left foot elevated higher than heart

## 2017-12-08 NOTE — Progress Notes (Signed)
RN attempted to get patient up to chair as ordered. Patient sitting on side of the bed with legs dangling. Patient began to feel nauseated and lighted headed. Patient also became pale and verbalized that she had ringing in her ears. Patient assisted back to a lying position. BP 93/54 , no c/o shortness of breath or difficulty breathing. Cala BradfordKimberly from [facility] present on the unit was given the information about patient's symptoms in person. No new orders were given at the time. Anselm Junglingonyers,Yerick Eggebrecht M

## 2017-12-08 NOTE — H&P (Signed)
[facility] History & Physical Update  The patient was interviewed and re-examined.  The patient's previous History and Physical has been reviewed and is unchanged.  There is no change in the plan of care. We plan to proceed with the scheduled procedure.  Levora DredgeGregory Lahari Suttles, MD  12/08/2017, 11:59 AM

## 2017-12-08 NOTE — Progress Notes (Signed)
ANTICOAGULATION CONSULT NOTE - Initial Consult  Pharmacy Consult for Heparin Drip Indication: VTE (pt s/p thrombectomy in vascular lab)   No Known Allergies  Patient Measurements: Height: 5' (152.4 cm) Weight: 138 lb (62.6 kg) IBW/kg (Calculated) : 45.5 Heparin Dosing Weight: 58.6 kg  Vital Signs: Temp: 98.5 F (36.9 C) (01/04 1110) Temp Source: Oral (01/04 1110) BP: 118/70 (01/04 1110) Pulse Rate: 70 (01/04 1110)  Labs: Recent Labs    12/08/17 1030  CREATININE 0.57    Estimated Creatinine Clearance: 88 mL/min (by C-G formula based on SCr of 0.57 mg/dL).   Medical History: Past Medical History:  Diagnosis Date  . Asthma     Medications:  Scheduled:   Infusions:  . sodium chloride 100 mL/hr at 12/08/17 1137  . heparin      Assessment: 27 yo F s/p thrombectomy for DVT in Vascular lab to start Heparin drip for VTE. Patient received Alteplase 12 mg in vascular and Heparin inj 3000 units at 1246 and 2000 units at 1343. Patient on Xarelto at home with last dose per Med Rec on 12/07/17 at 1800.   Baseline labs ordered- except for aPTT b/c patient had already received Heparin injections.  Goal of Therapy:  Goal HL (in combo w/ lytic therapy)= 0.2-0.5 Goal aPTT: 66-102 Monitor platelets by anticoagulation protocol: Yes   Plan:  Will start Heparin drip with no bolus since patient received Heparin injections in vascular lab. Begin Heparin drip at 650 units/hr. Will check Heparin level and aPTT in 6 hours. Will determine whether to follow Heparin level vs aPTT based on results (due to Xarelto use).   Farris Geiman A 12/08/2017,2:24 PM

## 2017-12-08 NOTE — Progress Notes (Signed)
ANTICOAGULATION CONSULT NOTE - Initial Consult  Pharmacy Consult for Heparin Drip Indication: VTE (pt s/p thrombectomy in vascular lab)   No Known Allergies  Patient Measurements: Height: 5' (152.4 cm) Weight: 138 lb (62.6 kg) IBW/kg (Calculated) : 45.5 Heparin Dosing Weight: 58.6 kg  Vital Signs: Temp: 98.6 F (37 C) (01/04 2139) Temp Source: Oral (01/04 2139) BP: 93/54 (01/04 2139) Pulse Rate: 74 (01/04 2139)  Labs: Recent Labs    12/08/17 1030 12/08/17 2120  APTT  --  44*  HEPARINUNFRC  --  0.29*  CREATININE 0.57  --     Estimated Creatinine Clearance: 88 mL/min (by C-G formula based on SCr of 0.57 mg/dL).   Medical History: Past Medical History:  Diagnosis Date  . Asthma     Medications:  Scheduled:  . oxyCODONE      . [START ON 12/09/2017] rivaroxaban  15 mg Oral BID  . sodium chloride flush  3 mL Intravenous Q12H   Infusions:  . sodium chloride    . heparin 650 Units/hr (12/08/17 1818)    Assessment: 27 yo F s/p thrombectomy for DVT in Vascular lab to start Heparin drip for VTE. Patient received Alteplase 12 mg in vascular and Heparin inj 3000 units at 1246 and 2000 units at 1343. Patient on Xarelto at home with last dose per Med Rec on 12/07/17 at 1800.   Baseline labs ordered- except for aPTT b/c patient had already received Heparin injections.  Goal of Therapy:  Goal HL (in combo w/ lytic therapy)= 0.2-0.5 Goal aPTT: 66-102 Monitor platelets by anticoagulation protocol: Yes   Plan:  Will start Heparin drip with no bolus since patient received Heparin injections in vascular lab. Begin Heparin drip at 650 units/hr. Will check Heparin level and aPTT in 6 hours. Will determine whether to follow Heparin level vs aPTT based on results (due to Xarelto use).  01/04 @ 2130 aPTT 44, HL 0.29; aPTT subtherapeutic, but HL therapeutic per goal range for lytic therapy. Will continue rate as is, will not draw another level as heparin drip will be d/c'd @ 0800  and patient will be restarted on home rivaroxaban.  Thomasene Rippleavid Johnanthony Wilden, PharmD, BCPS Clinical Pharmacist 12/08/2017

## 2017-12-09 DIAGNOSIS — I82422 Acute embolism and thrombosis of left iliac vein: Secondary | ICD-10-CM | POA: Diagnosis not present

## 2017-12-09 DIAGNOSIS — I82402 Acute embolism and thrombosis of unspecified deep veins of left lower extremity: Secondary | ICD-10-CM | POA: Diagnosis not present

## 2017-12-09 LAB — CBC
HCT: 32 % — ABNORMAL LOW (ref 35.0–47.0)
HEMOGLOBIN: 10.8 g/dL — AB (ref 12.0–16.0)
MCH: 28.5 pg (ref 26.0–34.0)
MCHC: 33.7 g/dL (ref 32.0–36.0)
MCV: 84.5 fL (ref 80.0–100.0)
PLATELETS: 267 10*3/uL (ref 150–440)
RBC: 3.79 MIL/uL — AB (ref 3.80–5.20)
RDW: 12.6 % (ref 11.5–14.5)
WBC: 8.2 10*3/uL (ref 3.6–11.0)

## 2017-12-09 NOTE — Discharge Summary (Signed)
[facility]IN [facility]    Discharge Summary  Patient ID:  Gabrielle Parrish MRN: [id number] DOB/AGE: 1991-08-26 27 y.o.  Admit date: 12/08/2017 Discharge date: 12/09/2017 Date of Surgery: 12/08/2017 Surgeon: Surgeon(s): Schnier, Latina Craver, MD  Admission Diagnosis: Left leg DVT [facility]) [I82.402]  Discharge Diagnoses:  Left leg DVT (HCC) [I82.402]  Secondary Diagnoses: Past Medical History:  Diagnosis Date  . Asthma    Procedure(s): PERIPHERAL VASCULAR THROMBECTOMY IVC FILTER INSERTION  Discharged Condition: good  HPI:  On November 24, 2017 the patient was seen at [facility] emergency room and noted to have iliofemoral DVT as well as pulmonary emboli.  At that time, she was started on Xarelto and discharged home. She was not offered thrombolysis at that time.    Since that time, she had noted improvement in her shortness of breath and her pleuritic type chest pains have gone away.  Her left leg continues to be a significant problem.  She states it continues to be very painful.  She has to walk with a limp because of the swelling and pain.  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  She does note that when she is able to elevate her left leg does feel significantly better.  She also notes that in the morning her leg is better. She has been faithfully taking her Xarelto.  On December 09, 2007 patient underwent a ultrasound guidance for vascular access to the left popliteal vein, catheter placement into the inferior vena cava for placement of IVC filter, inferior venacavogram, placement of a Celect IVC filter infrarenal, introduction catheter into venous system second order catheter placement left popliteal approach, infusion thrombolysis with 12 mg of TPA, mechanical thrombectomy of the left common femoral, external iliac and common iliac vein using the penumbra cat 8 catheter, percutaneous transluminal  angioplasty and stent placement left common and external iliac veins with percutaneous transluminal angioplasty left common femoral vein.  The patient tolerated the procedure well.  The patient night of procedure was unremarkable and she was discharged home the next day without complication.  Hospital Course:  Gabrielle Parrish is a 27 y.o. female is s/p:  Procedure(s): PERIPHERAL VASCULAR THROMBECTOMY IVC FILTER INSERTION  Physical exam:  Alert and oriented x3, NAD CV: RRR Pulm: Clear to auscultation bilaterally Abdomen: Soft, nontender, nondistended, bowel sounds auscultated Vascular:   Right groin: Dressing intact.  No drainage.  No swelling.  Left lower extremity: Thigh soft, calf soft.  2+ pedal pulses noted  Post-op wounds clean, dry, intact or healing well Pt. Ambulating, voiding and taking PO diet without difficulty. Pt pain controlled with PO pain meds.  Complications:none  Consults: none  Significant Diagnostic Studies: CBC Lab Results  Component Value Date   WBC 8.2 12/09/2017   HGB 10.8 (L) 12/09/2017   HCT 32.0 (L) 12/09/2017   MCV 84.5 12/09/2017   PLT 267 12/09/2017   BMET    Component Value Date/Time   NA 139 12/08/2017 1030   K 4.1 12/08/2017 1030   CL 107 12/08/2017 1030   CO2 23 12/08/2017 1030   GLUCOSE 90 12/08/2017 1030   BUN 10 12/08/2017 1030   CREATININE 0.57 12/08/2017 1030   CALCIUM 9.0 12/08/2017 1030   GFRNONAA >60 12/08/2017 1030   GFRAA >60 12/08/2017 1030   COAG No results found for: INR, PROTIME  Disposition:  Discharge to :Home  Allergies as of 12/09/2017   No Known Allergies  Medication List    TAKE these medications   acetaminophen 500 MG tablet Commonly known as:  TYLENOL Take 250-500 mg by mouth every 6 (six) hours as needed (for pain.).   albuterol 108 (90 Base) MCG/ACT inhaler Commonly known as:  PROVENTIL HFA;VENTOLIN HFA Inhale 1-2 puffs into the lungs every 6 (six) hours as needed for wheezing or  shortness of breath.   Rivaroxaban 15 MG Tabs tablet Commonly known as:  XARELTO Take 15 mg by mouth 2 (two) times daily with a meal.   TRI-SPRINTEC PO Take 1 tablet by mouth daily.      Verbal and written Discharge instructions given to the patient. Wound care per Discharge AVS Follow-up Information    Schnier, Latina CraverGregory G, MD Follow up in 3 week(s).   Specialties:  Vascular Surgery, Cardiology, Radiology, Vascular Surgery Why:  Discuss IVC filter removal. Patient needs left lower extremity DVT study.  Contact information: [address] [place] [address] [phone]          Signed: Tonette LedererKIMBERLY A Cleta Heatley, PA-C  12/09/2017, 1:37 PM

## 2017-12-09 NOTE — Progress Notes (Signed)
Patient verbalizes that she feels better , Patient  Was able to get up to chair at this time . No c/o nausea, dizziness, or ringing in her ears. Family at bedside at this time. Anselm Junglingonyers,Shalanda Brogden M

## 2017-12-09 NOTE — Discharge Instructions (Signed)
Patient can remove groin dressing and shower as of tomorrow. Patient has a prescription for tramadol at home.  The patient should continue this medication as needed for pain. She can call our office and request a refill if needed in the future. The patient should wear knee-high compression stockings (20-5230mmgHg) to the bilateral lower extremity. They should be placed in the morning and removed at night. You do not need to sleep in your compression stockings.   Please elevate your legs heart level or higher as much as possible.  Patient may return to work Monday December 11, 2017.   If the patient needs a more formal note, please contact the office Monday morning and we will be happy to provide the patient with one.

## 2017-12-11 ENCOUNTER — Telehealth (INDEPENDENT_AMBULATORY_CARE_PROVIDER_SITE_OTHER): Payer: Self-pay | Admitting: Vascular Surgery

## 2017-12-11 ENCOUNTER — Encounter (INDEPENDENT_AMBULATORY_CARE_PROVIDER_SITE_OTHER): Payer: Self-pay

## 2017-12-11 ENCOUNTER — Encounter: Payer: Self-pay | Admitting: Vascular Surgery

## 2017-12-11 ENCOUNTER — Encounter (INDEPENDENT_AMBULATORY_CARE_PROVIDER_SITE_OTHER): Payer: Self-pay | Admitting: Vascular Surgery

## 2017-12-11 NOTE — Telephone Encounter (Signed)
Patient was inform to come to the office to pick up her letter

## 2017-12-11 NOTE — Telephone Encounter (Signed)
New Message  Pt verbalized wanting to know if she can get a work note from procedure on Friday and for missing work today from being in pain.  Please f/u with pt

## 2017-12-12 ENCOUNTER — Encounter: Payer: Self-pay | Admitting: Vascular Surgery

## 2017-12-15 ENCOUNTER — Encounter (INDEPENDENT_AMBULATORY_CARE_PROVIDER_SITE_OTHER): Payer: Self-pay | Admitting: Vascular Surgery

## 2017-12-15 ENCOUNTER — Other Ambulatory Visit (INDEPENDENT_AMBULATORY_CARE_PROVIDER_SITE_OTHER): Payer: Self-pay | Admitting: Vascular Surgery

## 2017-12-15 ENCOUNTER — Ambulatory Visit
Admission: RE | Admit: 2017-12-15 | Discharge: 2017-12-15 | Disposition: A | Discharge status: Home / Self Care | Payer: BLUE CROSS/BLUE SHIELD | Source: Ambulatory Visit | Attending: Vascular Surgery | Admitting: Vascular Surgery

## 2017-12-15 ENCOUNTER — Telehealth (INDEPENDENT_AMBULATORY_CARE_PROVIDER_SITE_OTHER): Payer: Self-pay

## 2017-12-15 DIAGNOSIS — I2699 Other pulmonary embolism without acute cor pulmonale: Secondary | ICD-10-CM

## 2017-12-15 DIAGNOSIS — I82422 Acute embolism and thrombosis of left iliac vein: Secondary | ICD-10-CM

## 2017-12-15 DIAGNOSIS — R0789 Other chest pain: Secondary | ICD-10-CM | POA: Diagnosis present

## 2017-12-15 MED ORDER — IOPAMIDOL (ISOVUE-370) INJECTION 76%
75.0000 mL | Freq: Once | INTRAVENOUS | Status: AC | PRN
  Administered 2017-12-15: 75 mL via INTRAVENOUS

## 2017-12-15 NOTE — Telephone Encounter (Signed)
I am going to be ordering a CTA of the chest STAT.  Since the order is STAT - we need to contact the radiology department and then the patient.  I spoke with Vernona RiegerLaura and she asked that you please arrange this.  Please call the patient to assure that she is taking her anticoagulation which I believe is Xarelto as well .

## 2017-12-15 NOTE — Progress Notes (Unsigned)
CTA

## 2017-12-15 NOTE — Telephone Encounter (Signed)
The patient's CTA of her chest was negative.  There is no acute / emergent pathology noted. Unfortunately, I do not know what is causing her pain with yawning, pleuritic chest pain or back pain.  If it continues, I suggest she follow up with her PCP. I discussed this with Dr. Gilda CreaseSchnier as well.

## 2017-12-15 NOTE — Telephone Encounter (Signed)
Patient had procedure done on 12/08/17.  Patient is having pain when she inhales and yawns, pain is mostly in her back region.   Wants to know if this is normal? Should she be seen, concerned or go to the ER?

## 2017-12-15 NOTE — Telephone Encounter (Signed)
Getting authorization. Will let the patient know the location and time for her to her the CTA chest done today. Patient states that she has still been taking her Xarelto daily.

## 2017-12-15 NOTE — Telephone Encounter (Signed)
Called the patient back to give her the results and the instructions on what to do if her conditions worsen and to follow up with her PCP.

## 2018-01-02 ENCOUNTER — Other Ambulatory Visit: Payer: Self-pay | Admitting: *Deleted

## 2018-01-02 ENCOUNTER — Other Ambulatory Visit: Payer: Self-pay

## 2018-01-02 ENCOUNTER — Encounter: Payer: Self-pay | Admitting: *Deleted

## 2018-01-02 ENCOUNTER — Encounter: Payer: Self-pay | Admitting: Oncology

## 2018-01-02 ENCOUNTER — Inpatient Hospital Stay: Payer: BLUE CROSS/BLUE SHIELD

## 2018-01-02 ENCOUNTER — Inpatient Hospital Stay: Payer: BLUE CROSS/BLUE SHIELD | Attending: Oncology | Admitting: Oncology

## 2018-01-02 VITALS — BP 108/72 | HR 63 | Temp 97.8°F | Resp 14 | Wt 134.0 lb

## 2018-01-02 DIAGNOSIS — I82422 Acute embolism and thrombosis of left iliac vein: Secondary | ICD-10-CM

## 2018-01-02 DIAGNOSIS — I2699 Other pulmonary embolism without acute cor pulmonale: Secondary | ICD-10-CM | POA: Insufficient documentation

## 2018-01-02 DIAGNOSIS — Z79899 Other long term (current) drug therapy: Secondary | ICD-10-CM

## 2018-01-02 DIAGNOSIS — Z7901 Long term (current) use of anticoagulants: Secondary | ICD-10-CM | POA: Diagnosis not present

## 2018-01-02 LAB — COMPREHENSIVE METABOLIC PANEL
ALBUMIN: 4.4 g/dL (ref 3.5–5.0)
ALT: 12 U/L — ABNORMAL LOW (ref 14–54)
ANION GAP: 8 (ref 5–15)
AST: 23 U/L (ref 15–41)
Alkaline Phosphatase: 54 U/L (ref 38–126)
BILIRUBIN TOTAL: 0.8 mg/dL (ref 0.3–1.2)
BUN: 8 mg/dL (ref 6–20)
CHLORIDE: 103 mmol/L (ref 101–111)
CO2: 24 mmol/L (ref 22–32)
Calcium: 9.1 mg/dL (ref 8.9–10.3)
Creatinine, Ser: 0.66 mg/dL (ref 0.44–1.00)
GFR calc Af Amer: >60 mL/min (ref >60)
Glucose, Bld: 83 mg/dL (ref 65–99)
POTASSIUM: 3.7 mmol/L (ref 3.5–5.1)
Sodium: 135 mmol/L (ref 135–145)
TOTAL PROTEIN: 7.7 g/dL (ref 6.5–8.1)

## 2018-01-02 LAB — CBC WITH DIFFERENTIAL/PLATELET
BASOS PCT: 1 %
Basophils Absolute: 0 10*3/uL (ref 0–0.1)
Eosinophils Absolute: 0.3 10*3/uL (ref 0–0.7)
Eosinophils Relative: 5 %
HEMATOCRIT: 35.8 % (ref 35.0–47.0)
Hemoglobin: 11.9 g/dL — ABNORMAL LOW (ref 12.0–16.0)
Lymphocytes Relative: 34 %
Lymphs Abs: 2.3 10*3/uL (ref 1.0–3.6)
MCH: 28.4 pg (ref 26.0–34.0)
MCHC: 33.3 g/dL (ref 32.0–36.0)
MCV: 85.2 fL (ref 80.0–100.0)
MONO ABS: 0.4 10*3/uL (ref 0.2–0.9)
MONOS PCT: 6 %
NEUTROS ABS: 3.8 10*3/uL (ref 1.4–6.5)
Neutrophils Relative %: 54 %
PLATELETS: 253 10*3/uL (ref 150–440)
RBC: 4.21 MIL/uL (ref 3.80–5.20)
RDW: 13.7 % (ref 11.5–14.5)
WBC: 6.9 10*3/uL (ref 3.6–11.0)

## 2018-01-02 MED ORDER — XARELTO 20 MG PO TABS: 20.0000 mg | ORAL_TABLET | Freq: Every day | ORAL | 4 refills | Status: DC

## 2018-01-02 NOTE — Progress Notes (Signed)
Hematology/Oncology Consult note [facility] [phone] [phone]  Patient Care Team: [facility], [facility] as PCP - General (General Practice)   Name of the patient: Gabrielle Parrish  [id number]  10/02/1991    Reason for referral- h/o DVT and PE   Referring [facility]- [facility]  Date of visit: 01/02/18   History of presenting illness-patient is a 27 year old female with a past medical history significant for migraines who was also on birth control pills since early 2018.  She presented to the [facility] ER with symptoms of left leg pain and swelling.  Patient was found to have acute obstruction that was extending proximal to the inguinal ligament.    CTA showed pulmonary embolism within the distal left lower lobar artery and few segmental branches.  Patient was discharged on Xarelto.Patient's shortness of breath did improve significantly but she continued to have pain and swelling in her left leg to the point that she was walking with a limp and having difficulty at her work.  She underwent mechanical thrombectomy of the left common femoral, external iliac and common iliac vein as well as IVC filter placement.  CT abdomen done at [facility] on 11/24/2017 showed: Acute large venous clot which extends from the infrarenal IVC distally into the left common external iliac and left femoral vein.  Right ovarian cystic lesion measuring 3.7 cm.  Consider pelvic ultrasound on a nonemergent basis.  Patient was on progestin only birth control pill for about 2 years before she switched to combination estrogen progesterone pill about 6 months prior to her DVT.  Currently she is off birth control pills.  No prior history of DVT or PE.  She is single and unmarried and has never had any children not has not had any other abortions or miscarriages.  No family history of DVT or PE  Patient did have significant left lower extremity pain and  swelling which is better after thrombolysis and IVC filter placement  ECOG PS- 0  Pain scale- 0   Review of systems- Review of Systems  Constitutional: Negative for chills, fever, malaise/fatigue and weight loss.  HENT: Negative for congestion, ear discharge and nosebleeds.   Eyes: Negative for blurred vision.  Respiratory: Negative for cough, hemoptysis, sputum production, shortness of breath and wheezing.   Cardiovascular: Positive for leg swelling. Negative for chest pain, palpitations, orthopnea and claudication.  Gastrointestinal: Negative for abdominal pain, blood in stool, constipation, diarrhea, heartburn, melena, nausea and vomiting.  Genitourinary: Negative for dysuria, flank pain, frequency, hematuria and urgency.  Musculoskeletal: Negative for back pain, joint pain and myalgias.  Skin: Negative for rash.  Neurological: Negative for dizziness, tingling, focal weakness, seizures, weakness and headaches.  Endo/Heme/Allergies: Does not bruise/bleed easily.  Psychiatric/Behavioral: Negative for depression and suicidal ideas. The patient does not have insomnia.     No Known Allergies  Patient Active Problem List   Diagnosis Date Noted  . DVT (deep venous thrombosis) (HCC) 12/08/2017  . Acute pulmonary embolism (HCC) 12/08/2017  . Asthma 12/08/2017  . Left leg DVT (HCC) 12/08/2017     Past Medical History:  Diagnosis Date  . Asthma   . Clotting disorder [facility])      Past Surgical History:  Procedure Laterality Date  . IVC FILTER INSERTION Left 12/08/2017   Procedure: IVC FILTER INSERTION;  Surgeon: Renford Dills, MD;  Location: [facility] INVASIVE CV LAB;  Service: Cardiovascular;  Laterality: Left;  . PERIPHERAL VASCULAR THROMBECTOMY Left 12/08/2017   Procedure:  PERIPHERAL VASCULAR THROMBECTOMY;  Surgeon: Renford DillsSchnier, Gregory G, MD;  Location: [facility] INVASIVE CV LAB;  Service: Cardiovascular;  Laterality: Left;    Social History   Socioeconomic History  . Marital status:  Single    Spouse name: Not on file  . Number of children: Not on file  . Years of education: Not on file  . Highest education level: Not on file  Social Needs  . Financial resource strain: Not on file  . Food insecurity - worry: Not on file  . Food insecurity - inability: Not on file  . Transportation needs - medical: Not on file  . Transportation needs - non-medical: Not on file  Occupational History  . Not on file  Tobacco Use  . Smoking status: Never Smoker  . Smokeless tobacco: Never Used  Substance and Sexual Activity  . Alcohol use: Yes    Comment: occ  . Drug use: No  . Sexual activity: Not on file  Other Topics Concern  . Not on file  Social History Narrative  . Not on file     Family History  Problem Relation Age of Onset  . Healthy Mother   . Healthy Father      Current Outpatient Medications:  .  acetaminophen (TYLENOL) 500 MG tablet, Take 250-500 mg by mouth every 6 (six) hours as needed (for pain.)., Disp: , Rfl:  .  albuterol (PROVENTIL HFA;VENTOLIN HFA) 108 (90 Base) MCG/ACT inhaler, Inhale 1-2 puffs into the lungs every 6 (six) hours as needed for wheezing or shortness of breath., Disp: , Rfl:  .  Safflower Oil (TONALIN CLA) 1000 MG CAPS, Take by mouth 2 (two) times daily., Disp: , Rfl:  .  XARELTO 20 MG TABS tablet, Take 1 tablet (20 mg total) by mouth daily with supper., Disp: 30 tablet, Rfl: 4   Physical exam:  Vitals:   01/02/18 1453 01/02/18 1500  BP:  108/72  Pulse:  63  Resp:  14  Temp:  97.8 F (36.6 C)  TempSrc:  Tympanic  Weight: 134 lb (60.8 kg)    Physical Exam  Constitutional: She is oriented to person, place, and time and well-developed, well-nourished, and in no distress.  HENT:  Head: Normocephalic and atraumatic.  Eyes: EOM are normal. Pupils are equal, round, and reactive to light.  Neck: Normal range of motion.  Cardiovascular: Normal rate, regular rhythm and normal heart sounds.  Pulmonary/Chest: Effort normal and breath  sounds normal.  Abdominal: Soft. Bowel sounds are normal.  Musculoskeletal:  Left lower extremity is diffusely more swollen as compared to right lower extremity  Neurological: She is alert and oriented to person, place, and time.  Skin: Skin is warm and dry.       CMP Latest Ref Rng & Units 12/08/2017  Glucose 65 - 99 mg/dL 90  BUN 6 - 20 mg/dL 10  Creatinine 8.290.44 - 5.621.00 mg/dL 1.300.57  Sodium 865135 - 784145 mmol/L 139  Potassium 3.5 - 5.1 mmol/L 4.1  Chloride 101 - 111 mmol/L 107  CO2 22 - 32 mmol/L 23  Calcium 8.9 - 10.3 mg/dL 9.0  Total Protein 6.5 - 8.1 g/dL -  Total Bilirubin 0.3 - 1.2 mg/dL -  Alkaline Phos 38 - 696126 U/L -  AST 15 - 41 U/L -  ALT 14 - 54 U/L -   CBC Latest Ref Rng & Units 12/09/2017  WBC 3.6 - 11.0 K/uL 8.2  Hemoglobin 12.0 - 16.0 g/dL 10.8(L)  Hematocrit 35.0 - 47.0 % 32.0(L)  Platelets 150 - 440 K/uL 267    No images are attached to the encounter.  Ct Angio Chest Pe W Or Wo Contrast  Result Date: 12/15/2017 CLINICAL DATA:  Right-sided chest pain. Reported history of deep venous thrombosis EXAM: CT ANGIOGRAPHY CHEST WITH CONTRAST TECHNIQUE: Multidetector CT imaging of the chest was performed using the standard protocol during bolus administration of intravenous contrast. Multiplanar CT image reconstructions and MIPs were obtained to evaluate the vascular anatomy. CONTRAST:  75mL ISOVUE-370 IOPAMIDOL (ISOVUE-370) INJECTION 76% COMPARISON:  None. FINDINGS: Cardiovascular: There is no demonstrable pulmonary embolus. There is no thoracic aortic aneurysm or dissection. The visualized great vessels appear normal. There is no pericardial effusion or pericardial thickening evident. Mediastinum/Nodes: Visualized thyroid appears normal. There is no appreciable thoracic adenopathy. No esophageal lesions are evident. Lungs/Pleura: There is no edema or consolidation. There is minimal scarring in the posterior left base. No pleural effusion or pleural thickening evident. Upper  Abdomen: Visualized upper abdominal structures appear unremarkable. Musculoskeletal: There are no blastic or lytic bone lesions. Review of the MIP images confirms the above findings. IMPRESSION: 1.  No demonstrable pulmonary embolus. 2.  No edema or consolidation. 3.  No appreciable thoracic adenopathy. Electronically Signed   By: Bretta Bang III M.D.   On: 12/15/2017 14:11    Assessment and plan- Patient is a 27 y.o. female referred for an extensive left lower extremity DVT from infrarenal IVC to distal external iliac and femoral vein as well as subsegmental PE.  She is currently on Xarelto and status post thrombolyzes and IVC filter placement  Although birth control is clearly a provoking risk factor for DVTs and PE, patient has had a DVT mainly in her iliofemoral region which is unusual.    Given the extensive clot burden as well as a concomitant subsegmental PE, I would like to do a hypercoagulable workup at this time.  I would recommend getting prothrombin gene mutation, factor V Leiden, antiphospholipid antibody panel including anticardiolipin antibody, beta-2 glycoprotein and hex phase.  I will also check a CBC and CMP today.    No other concerning findings of unintentional weight loss or palpable adenopathy that would suggest malignancy as a cause of DVT PE and I would not be doing any malignancy workup at this time  I would like the patient to stay on anticoagulation for 6 months following which she will come off anticoagulation for 2 weeks.  Following that I will check for protein C, protein S and Antithrombin III levels.  If the results of her hypercoagulability workup are positive I will consider giving her extended of anticoagulation.  Otherwise she will remain off anticoagulation after 6 months  Patient will be meeting with Dr. Gilda Crease soon to discuss removal of IVC filter in the near future  The fact that patient had estrogen associated DVT and PE, she would be at a risk for  future DVTs and PEs when she becomes pregnant.  I have advised the patient that she should see a hematologist as soon as she gets pregnant as she would need both antepartum and postpartum prophylaxis at that time.  Patient verbalized understanding  I will also get outside CT abdomen and pelvis reviewed with our radiologists here to see if there is any obvious evidence of compression in the iliofemoral region suggestive of may Thurner syndrome.  The fact that her DVT extended more proximally all the way up to infrarenal IVC, it makes May Thurner syndrome less likely.  Patient was also noted to  have an ovarian cyst on her CT abdomen which would need to be followed up by her primary care doctor  I will see the patient back in 6 months time after the results of her second set of hypercoagulability panel is back.  I have refilled her prescription for Xarelto for the next 5 months.  We will call her with the results of the first part of hypercoagulability testing that we have done today and if the results are abnormal I will see her sooner to discuss this in more detail.  Patient should remain off all estrogen and progesterone at this time  Thank you for this kind referral and the opportunity to participate in the care of this patient   Visit Diagnosis 1. Acute deep vein thrombosis (DVT) of iliac vein of left lower extremity (HCC)   2. Other acute pulmonary embolism without acute cor pulmonale (HCC)     Dr. Owens Shark, MD, MPH [facility] Pager- [phone] 01/02/2018

## 2018-01-03 ENCOUNTER — Other Ambulatory Visit (INDEPENDENT_AMBULATORY_CARE_PROVIDER_SITE_OTHER): Payer: Self-pay | Admitting: Vascular Surgery

## 2018-01-03 DIAGNOSIS — I82402 Acute embolism and thrombosis of unspecified deep veins of left lower extremity: Secondary | ICD-10-CM

## 2018-01-03 DIAGNOSIS — Z95828 Presence of other vascular implants and grafts: Secondary | ICD-10-CM

## 2018-01-03 LAB — HEX PHASE PHOSPHOLIPID REFLEX

## 2018-01-03 LAB — CARDIOLIPIN ANTIBODIES, IGG, IGM, IGA
Anticardiolipin IgA: <9 APL U/mL (ref 0–11)
Anticardiolipin IgG: <9 GPL U/mL (ref 0–14)
Anticardiolipin IgM: 9 MPL U/mL (ref 0–12)

## 2018-01-03 LAB — HEXAGONAL PHASE PHOSPHOLIPID: HEX PHOSPH NEUT TEST: 3 s (ref 0–11)

## 2018-01-04 ENCOUNTER — Ambulatory Visit (INDEPENDENT_AMBULATORY_CARE_PROVIDER_SITE_OTHER): Payer: BLUE CROSS/BLUE SHIELD | Admitting: Vascular Surgery

## 2018-01-04 ENCOUNTER — Encounter: Payer: Self-pay | Admitting: Oncology

## 2018-01-04 ENCOUNTER — Encounter (INDEPENDENT_AMBULATORY_CARE_PROVIDER_SITE_OTHER): Payer: Self-pay | Admitting: Vascular Surgery

## 2018-01-04 ENCOUNTER — Ambulatory Visit (INDEPENDENT_AMBULATORY_CARE_PROVIDER_SITE_OTHER): Payer: BLUE CROSS/BLUE SHIELD

## 2018-01-04 VITALS — BP 104/62 | HR 64 | Resp 13 | Ht 60.0 in | Wt 133.0 lb

## 2018-01-04 DIAGNOSIS — I82402 Acute embolism and thrombosis of unspecified deep veins of left lower extremity: Secondary | ICD-10-CM

## 2018-01-04 DIAGNOSIS — Z95828 Presence of other vascular implants and grafts: Secondary | ICD-10-CM | POA: Diagnosis not present

## 2018-01-04 DIAGNOSIS — I871 Compression of vein: Secondary | ICD-10-CM

## 2018-01-04 DIAGNOSIS — I82422 Acute embolism and thrombosis of left iliac vein: Secondary | ICD-10-CM | POA: Diagnosis not present

## 2018-01-04 DIAGNOSIS — J45909 Unspecified asthma, uncomplicated: Secondary | ICD-10-CM

## 2018-01-04 DIAGNOSIS — J45998 Other asthma: Secondary | ICD-10-CM | POA: Diagnosis not present

## 2018-01-04 LAB — BETA-2-GLYCOPROTEIN I ABS, IGG/M/A: Beta-2-Glycoprotein I IgA: <9 GPI IgA units (ref 0–25)

## 2018-01-07 ENCOUNTER — Encounter (INDEPENDENT_AMBULATORY_CARE_PROVIDER_SITE_OTHER): Payer: Self-pay | Admitting: Vascular Surgery

## 2018-01-07 DIAGNOSIS — I871 Compression of vein: Secondary | ICD-10-CM | POA: Insufficient documentation

## 2018-01-07 NOTE — Progress Notes (Signed)
MRN : [id number]  Gabrielle Parrish is a 27 y.o. (08-17-1991) female who presents with chief complaint of  Chief Complaint  Patient presents with  . Follow-up    3 week follow up/DVT study  .  History of Present Illness: The patient presents to the office for evaluation of DVT.  DVT was identified at [facility] by Duplex ultrasound.  The initial symptoms were pain and swelling in the lower extremity.  The patient notes the leg continues to be very painful with dependency and swells quite a bite.  Symptoms are much better with elevation.  The patient notes minimal edema in the morning which steadily worsens throughout the day.    The patient has not been using compression therapy at this point.  No SOB or pleuritic chest pains.  No cough or hemoptysis.  No blood per rectum or blood in any sputum.  No excessive bruising per the patient.       No outpatient medications have been marked as taking for the 01/04/18 encounter (Office Visit) with Gilda Crease, Latina Craver, MD.    Past Medical History:  Diagnosis Date  . Asthma   . Clotting disorder [facility])     Past Surgical History:  Procedure Laterality Date  . IVC FILTER INSERTION Left 12/08/2017   Procedure: IVC FILTER INSERTION;  Surgeon: Renford Dills, MD;  Location: [facility] INVASIVE CV LAB;  Service: Cardiovascular;  Laterality: Left;  . PERIPHERAL VASCULAR THROMBECTOMY Left 12/08/2017   Procedure: PERIPHERAL VASCULAR THROMBECTOMY;  Surgeon: Renford Dills, MD;  Location: [facility] INVASIVE CV LAB;  Service: Cardiovascular;  Laterality: Left;    Social History Social History   Tobacco Use  . Smoking status: Never Smoker  . Smokeless tobacco: Never Used  Substance Use Topics  . Alcohol use: Yes    Comment: occ  . Drug use: No    Family History Family History  Problem Relation Age of Onset  . Healthy Mother   . Healthy Father   . Cancer Maternal Grandmother   . Cancer Other     No Known Allergies   REVIEW OF SYSTEMS  (Negative unless checked)  Constitutional: [] Weight loss  [] Fever  [] Chills Cardiac: [] Chest pain   [] Chest pressure   [] Palpitations   [] Shortness of breath when laying flat   [] Shortness of breath with exertion. Vascular:  [] Pain in legs with walking   [] Pain in legs at rest  [x] History of DVT   [] Phlebitis   [x] Swelling in legs   [] Varicose veins   [] Non-healing ulcers Pulmonary:   [] Uses home oxygen   [] Productive cough   [] Hemoptysis   [] Wheeze  [] COPD   [] Asthma Neurologic:  [] Dizziness   [] Seizures   [] History of stroke   [] History of TIA  [] Aphasia   [] Vissual changes   [] Weakness or numbness in arm   [] Weakness or numbness in leg Musculoskeletal:   [] Joint swelling   [] Joint pain   [] Low back pain Hematologic:  [] Easy bruising  [] Easy bleeding   [] Hypercoagulable state   [] Anemic Gastrointestinal:  [] Diarrhea   [] Vomiting  [] Gastroesophageal reflux/heartburn   [] Difficulty swallowing. Genitourinary:  [] Chronic kidney disease   [] Difficult urination  [] Frequent urination   [] Blood in urine Skin:  [] Rashes   [] Ulcers  Psychological:  [] History of anxiety   []  History of major depression.  Physical Examination  Vitals:   01/04/18 1603  BP: 104/62  Pulse: 64  Resp: 13  Weight: 133 lb (60.3 kg)  Height: 5' (1.524 m)   Body mass  index is 25.97 kg/m. Gen: WD/WN, NAD Head: [place]/AT, No temporalis wasting.  Ear/Nose/Throat: Hearing grossly intact, nares w/o erythema or drainage Eyes: PER, EOMI, sclera nonicteric.  Neck: Supple, no large masses.   Pulmonary:  Good air movement, no audible wheezing bilaterally, no use of accessory muscles.  Cardiac: RRR, no JVD Vascular: left leg with moderate edema Vessel Right Left  Radial Palpable Palpable  PT Palpable Palpable  DP Palpable Palpable  Gastrointestinal: Non-distended. No guarding/no peritoneal signs.  Musculoskeletal: M/S 5/5 throughout.  No deformity or atrophy.  Neurologic: CN 2-12 intact. Symmetrical.  Speech is fluent. Motor  exam as listed above. Psychiatric: Judgment intact, Mood & affect appropriate for pt's clinical situation. Dermatologic: No rashes or ulcers noted.  No changes consistent with cellulitis. Lymph : No lichenification or skin changes of chronic lymphedema.  CBC Lab Results  Component Value Date   WBC 6.9 01/02/2018   HGB 11.9 (L) 01/02/2018   HCT 35.8 01/02/2018   MCV 85.2 01/02/2018   PLT 253 01/02/2018    BMET    Component Value Date/Time   NA 135 01/02/2018 1543   K 3.7 01/02/2018 1543   CL 103 01/02/2018 1543   CO2 24 01/02/2018 1543   GLUCOSE 83 01/02/2018 1543   BUN 8 01/02/2018 1543   CREATININE 0.66 01/02/2018 1543   CALCIUM 9.1 01/02/2018 1543   GFRNONAA >60 01/02/2018 1543   GFRAA >60 01/02/2018 1543   Estimated Creatinine Clearance: 86.5 mL/min (by C-G formula based on SCr of 0.66 mg/dL).  COAG No results found for: INR, PROTIME  Radiology Ct Angio Chest Pe W Or Wo Contrast  Result Date: 12/15/2017 CLINICAL DATA:  Right-sided chest pain. Reported history of deep venous thrombosis EXAM: CT ANGIOGRAPHY CHEST WITH CONTRAST TECHNIQUE: Multidetector CT imaging of the chest was performed using the standard protocol during bolus administration of intravenous contrast. Multiplanar CT image reconstructions and MIPs were obtained to evaluate the vascular anatomy. CONTRAST:  75mL ISOVUE-370 IOPAMIDOL (ISOVUE-370) INJECTION 76% COMPARISON:  None. FINDINGS: Cardiovascular: There is no demonstrable pulmonary embolus. There is no thoracic aortic aneurysm or dissection. The visualized great vessels appear normal. There is no pericardial effusion or pericardial thickening evident. Mediastinum/Nodes: Visualized thyroid appears normal. There is no appreciable thoracic adenopathy. No esophageal lesions are evident. Lungs/Pleura: There is no edema or consolidation. There is minimal scarring in the posterior left base. No pleural effusion or pleural thickening evident. Upper Abdomen:  Visualized upper abdominal structures appear unremarkable. Musculoskeletal: There are no blastic or lytic bone lesions. Review of the MIP images confirms the above findings. IMPRESSION: 1.  No demonstrable pulmonary embolus. 2.  No edema or consolidation. 3.  No appreciable thoracic adenopathy. Electronically Signed   By: Bretta BangWilliam  Woodruff III M.D.   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  IVC filter is in place and will be removed in 3 months.  Patient's duplex ultrasound of the venous system shows mild residual DVT from the popliteal to the femoral veins.  The patient is initiated on anticoagulation   Elevation was stressed, use of a recliner was discussed.  I have had a long discussion with the patient regarding DVT and post phlebitic changes such as swelling and why it  causes symptoms such as pain.  The patient will wear graduated compression stockings class 1 (20-30 mmHg), beginning after three full days of anticoagulation, on a daily basis a prescription was given. The  patient will  beginning wearing the stockings first thing in the morning and removing them in the evening. The patient is instructed specifically not to sleep in the stockings.  In addition, behavioral modification including elevation during the day and avoidance of prolonged dependency will be initiated.    The patient will continue anticoagulation for now as there have not been any problems or complications at this point.   - VAS [place] LOWER EXTREMITY VENOUS (DVT); Future  2. Acute deep vein thrombosis (DVT) of iliac vein of left lower extremity (HCC) See #1 - VAS [place] LOWER EXTREMITY VENOUS (DVT); Future  3. Mild asthma, unspecified whether complicated, unspecified whether persistent Continue pulmonary medications and aerosols as already ordered, these medications have been reviewed and there are no changes at this  time.      Levora Dredge, MD  01/07/2018 8:41 PM

## 2018-01-08 LAB — FACTOR 5 LEIDEN

## 2018-01-08 LAB — PROTHROMBIN GENE MUTATION

## 2018-01-11 ENCOUNTER — Other Ambulatory Visit: Payer: Self-pay | Admitting: *Deleted

## 2018-01-12 ENCOUNTER — Ambulatory Visit
Admission: RE | Admit: 2018-01-12 | Discharge: 2018-01-12 | Disposition: A | Discharge status: Home / Self Care | Payer: Self-pay | Source: Ambulatory Visit | Attending: Oncology | Admitting: Oncology

## 2018-01-12 ENCOUNTER — Other Ambulatory Visit: Payer: Self-pay | Admitting: Oncology

## 2018-01-12 DIAGNOSIS — R079 Chest pain, unspecified: Secondary | ICD-10-CM

## 2018-02-05 ENCOUNTER — Encounter (INDEPENDENT_AMBULATORY_CARE_PROVIDER_SITE_OTHER): Payer: Self-pay | Admitting: Vascular Surgery

## 2018-04-05 ENCOUNTER — Encounter (INDEPENDENT_AMBULATORY_CARE_PROVIDER_SITE_OTHER): Payer: Self-pay

## 2018-04-05 ENCOUNTER — Ambulatory Visit (INDEPENDENT_AMBULATORY_CARE_PROVIDER_SITE_OTHER): Payer: Self-pay | Admitting: Vascular Surgery

## 2018-06-04 ENCOUNTER — Other Ambulatory Visit: Payer: BLUE CROSS/BLUE SHIELD

## 2018-06-11 ENCOUNTER — Ambulatory Visit (INDEPENDENT_AMBULATORY_CARE_PROVIDER_SITE_OTHER): Payer: Self-pay | Admitting: Vascular Surgery

## 2018-06-19 ENCOUNTER — Ambulatory Visit: Payer: Self-pay | Admitting: Oncology

## 2018-09-11 IMAGING — CT CT OUTSIDE FILMS BODY
1 series · 16 of 32 positions shown, 20 images · IV contrast (APPLIED)
Comparison: none

[Series 2: a/p 3 min delay 5.0 b31f · axial · delayed · 0.98mm/px · z∈[+146,+586]mm · 16 of 96 slices shown, 20 images]
[im 4/96  mediastinal]
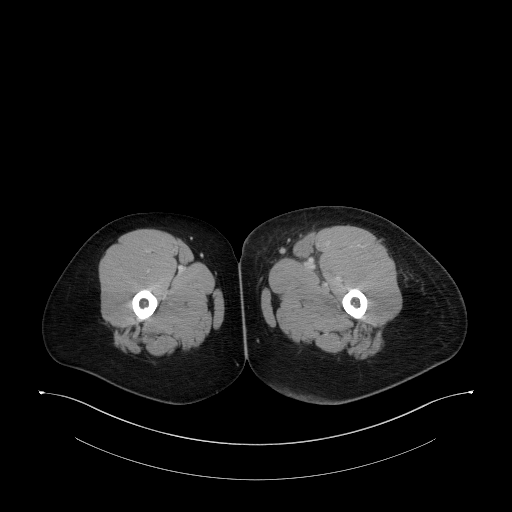
[im 4/96  lung]
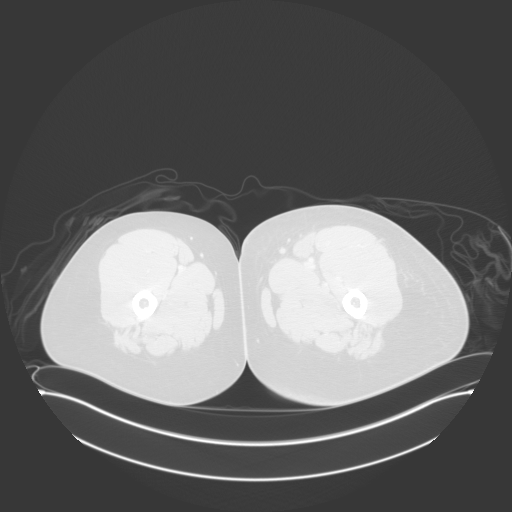
[im 11/96  lung]
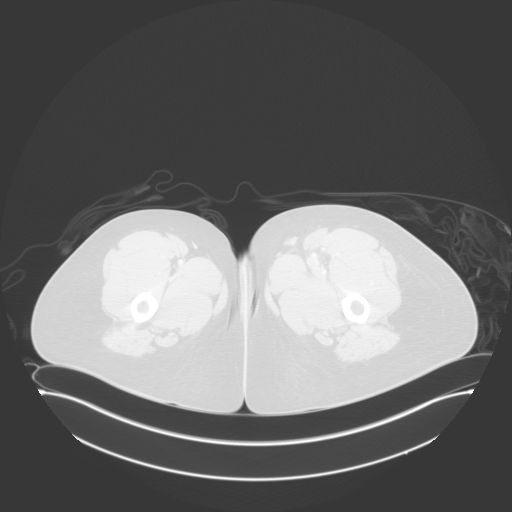
[im 18/96  lung]
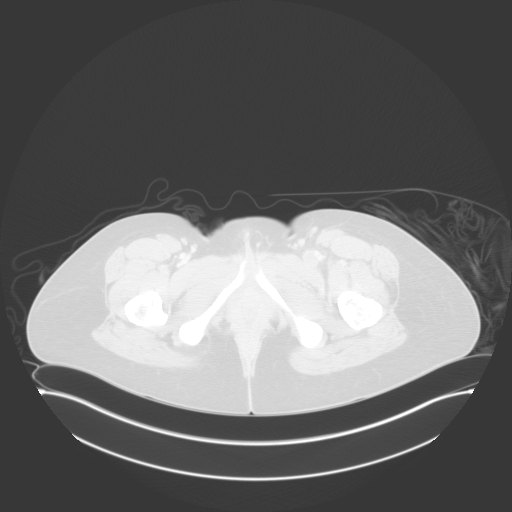
[im 22/96  lung]
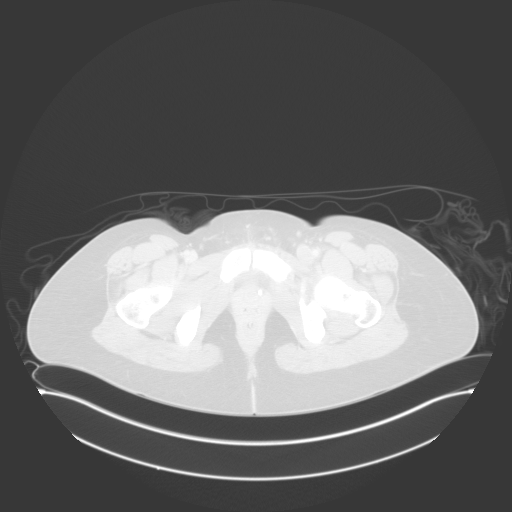
[im 29/96  mediastinal]
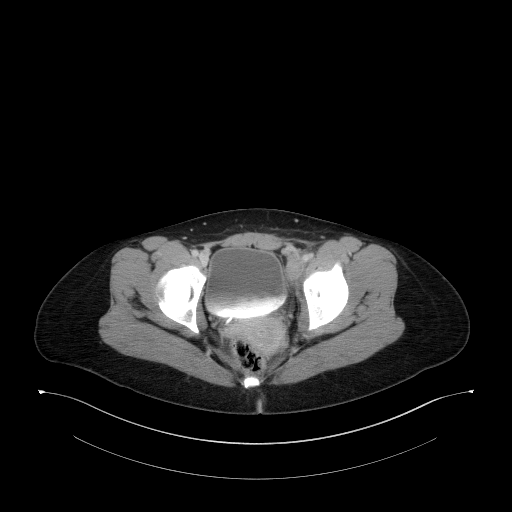
[im 29/96  lung]
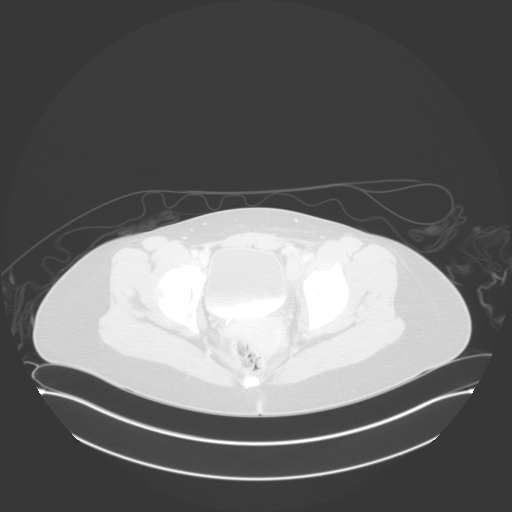
[im 36/96  lung]
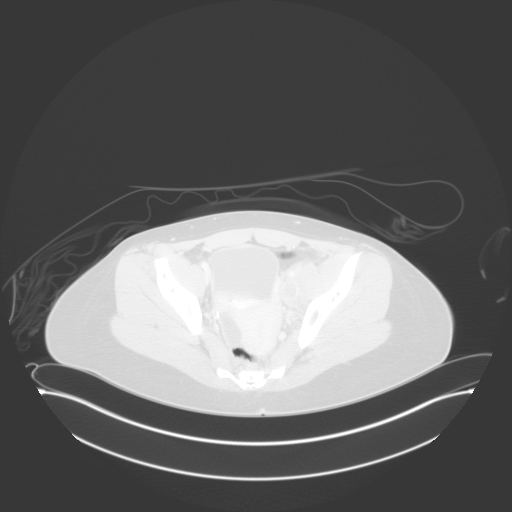
[im 43/96  lung]
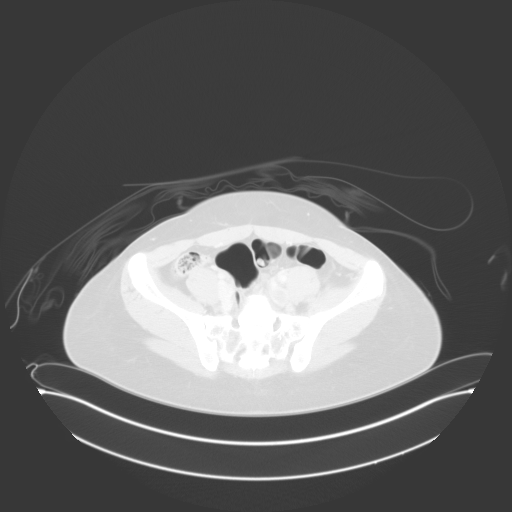
[im 47/96  lung]
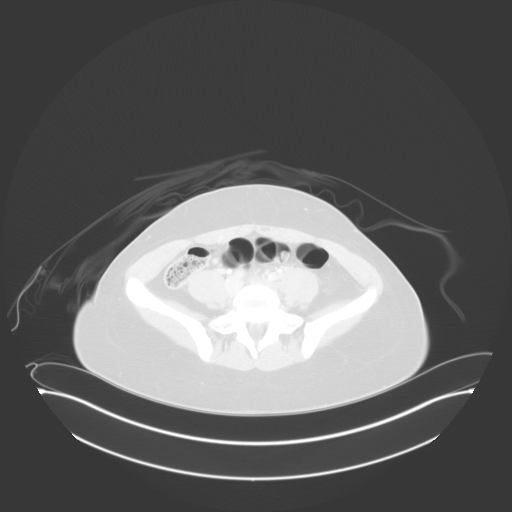
[im 50/96  mediastinal]
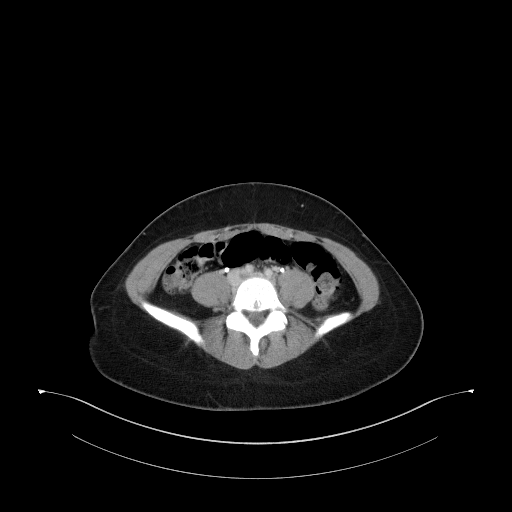
[im 50/96  lung]
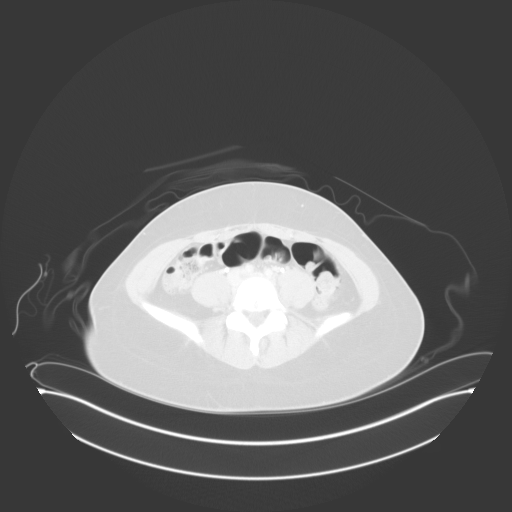
[im 57/96  lung]
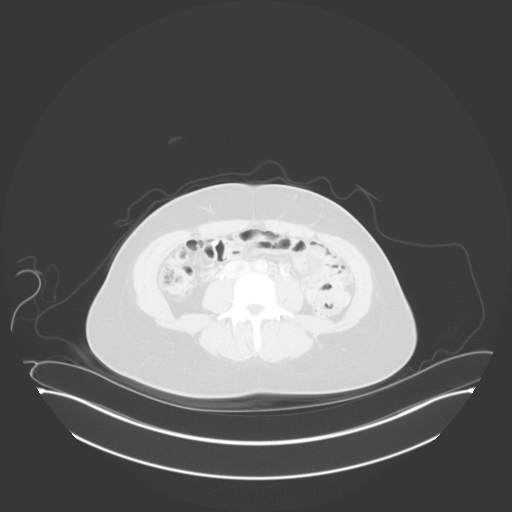
[im 60/96  lung]
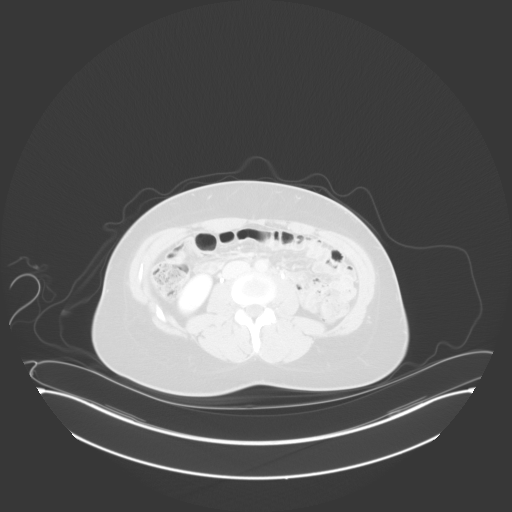
[im 67/96  lung]
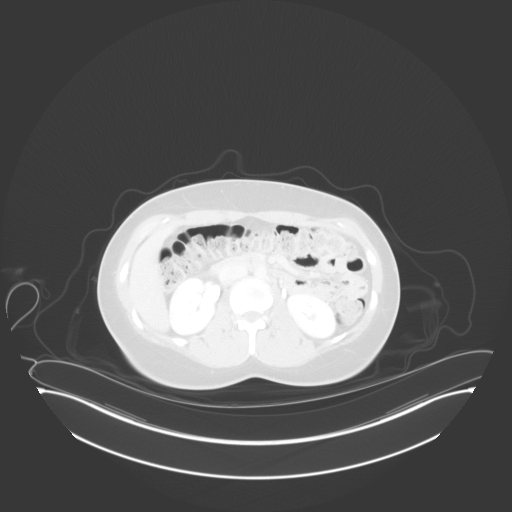
[im 74/96  mediastinal]
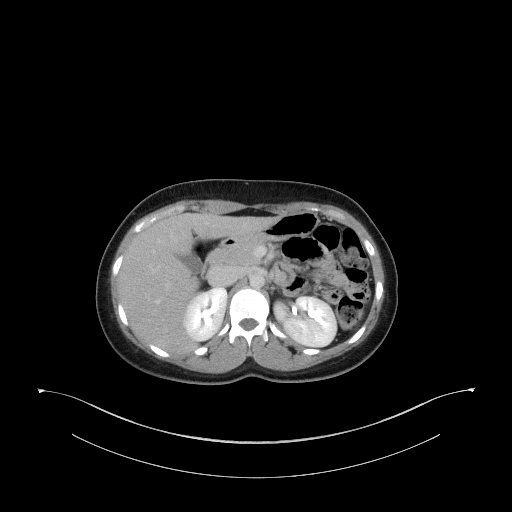
[im 74/96  lung]
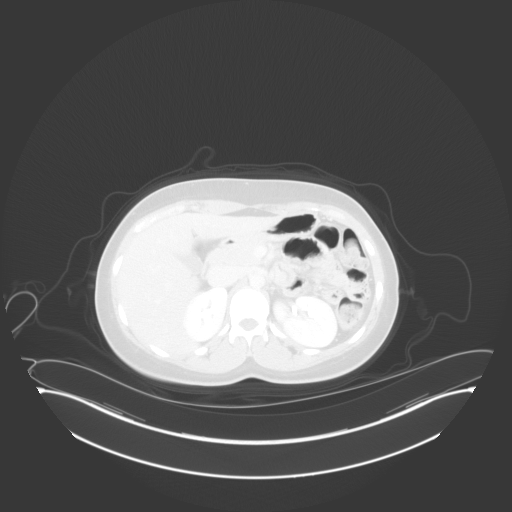
[im 78/96  lung]
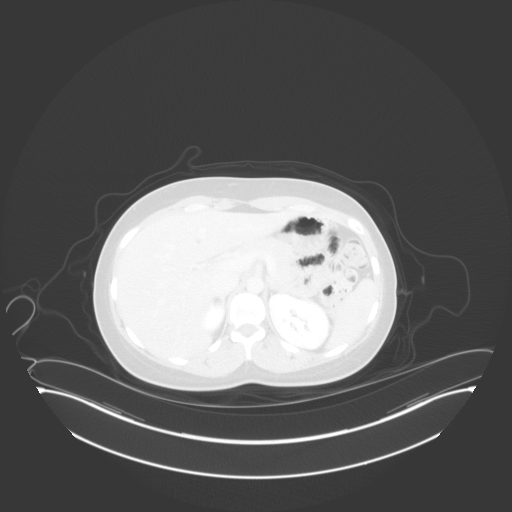
[im 85/96  lung]
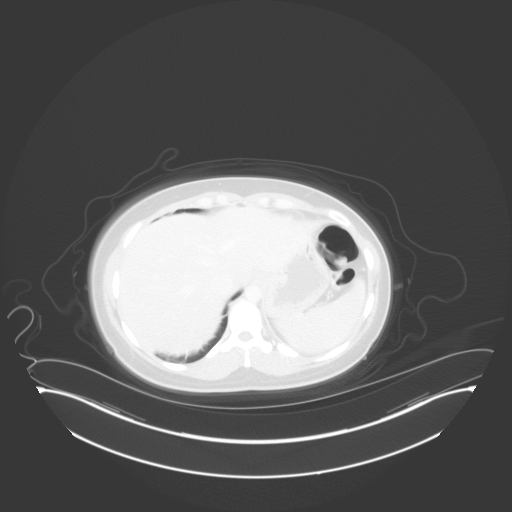
[im 92/96  lung]
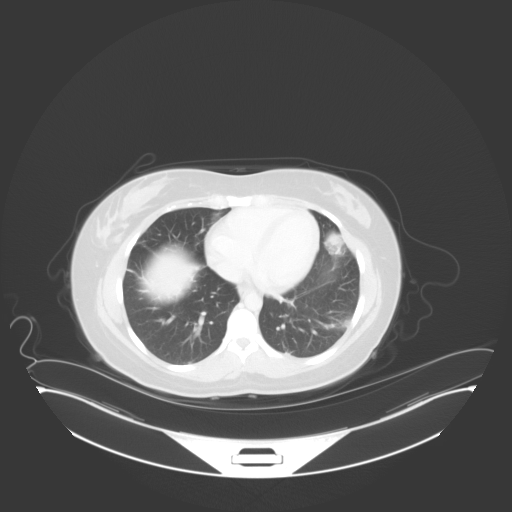

[16 of 32 positions shown; findings below may reference images not displayed]

Canned report from images found in remote index.

Refer to host system for actual result text.

## 2018-09-28 ENCOUNTER — Ambulatory Visit: Payer: Self-pay | Admitting: Obstetrics and Gynecology

## 2018-10-12 ENCOUNTER — Ambulatory Visit (INDEPENDENT_AMBULATORY_CARE_PROVIDER_SITE_OTHER): Payer: BLUE CROSS/BLUE SHIELD | Admitting: Obstetrics and Gynecology

## 2018-10-12 ENCOUNTER — Encounter: Payer: Self-pay | Admitting: Obstetrics and Gynecology

## 2018-10-12 VITALS — BP 106/70 | HR 82 | Ht 59.0 in | Wt 135.0 lb

## 2018-10-12 DIAGNOSIS — N939 Abnormal uterine and vaginal bleeding, unspecified: Secondary | ICD-10-CM | POA: Diagnosis not present

## 2018-10-12 DIAGNOSIS — N946 Dysmenorrhea, unspecified: Secondary | ICD-10-CM

## 2018-10-12 MED ORDER — NORETHINDRONE ACETATE 5 MG PO TABS: 5.0000 mg | ORAL_TABLET | Freq: Every day | ORAL | 11 refills | Status: DC

## 2018-10-12 NOTE — Progress Notes (Signed)
Gynecology Abnormal Uterine Bleeding Initial Evaluation   Chief Complaint:  Chief Complaint  Patient presents with  . Discuss contraception    needs medication to go along with current blood thinner    History of Present Illness:    Paitient is a 27 y.o. G0P0000 who LMP was Patient's last menstrual period was 09/24/2018 (exact date)., presents today for a problem visit.  She complains of menometrorrhagia that  began several months ago and its severity is described as moderate.  The patient menstrual complaints are chronic present for the oast 6 months but with recent acute exacerbationShe has irregular menses and they are associated with moderate menstrual cramping.   She currently uses oral progesterone-only contraceptivefor contraception.    Previous evaluation: None  Previous Treatment: POP.  LMP: Patient's last menstrual period was 09/24/2018 (exact date).  Paramter Normal / Abnormal Prsent  Frequency [place]     Infrequent (>38 days)     Normal (?24 days ?38 days) X   Freequent (<24 days)    Duration Normal (?8 days)     Prolonged (>8 days) X  Regularity Regular (shortest to longest cycle variation ?7-9 days)*     Irregular (shortest to longest cycle variation ?8-10days)* X  Flow Volume Light    (Self reported) Normal     Heavy X      Intermenstrual Bleeding None     Random X   Cyclical early     Cyclical mid     Cyclical late        Unscheduled Bleeding  Not applicable    (exogenous hormones) Absent     Present X   FIGO AUB I System: *The available evidence suggests that, using these criteria, the normal range (shortest to longest) varies with age: 9-25 y of age, ?46 d; 47-41 y, ?7 d; and for 70-45 y, ?9 d    Review of Systems: ROS  Past Medical History:  Past Medical History:  Diagnosis Date  . Asthma   . Clotting disorder [facility])     Past Surgical History:  Past Surgical History:  Procedure Laterality Date  . IVC FILTER INSERTION Left 12/08/2017     Procedure: IVC FILTER INSERTION;  Surgeon: Renford Dills, MD;  Location: [facility] INVASIVE CV LAB;  Service: Cardiovascular;  Laterality: Left;  . PERIPHERAL VASCULAR THROMBECTOMY Left 12/08/2017   Procedure: PERIPHERAL VASCULAR THROMBECTOMY;  Surgeon: Renford Dills, MD;  Location: [facility] INVASIVE CV LAB;  Service: Cardiovascular;  Laterality: Left;    Obstetric History: G0P0000  Family History:  Family History  Problem Relation Age of Onset  . Healthy Mother   . Healthy Father   . Cancer Maternal Grandmother   . Cancer Other     Social History:  Social History   Socioeconomic History  . Marital status: Single    Spouse name: Not on file  . Number of children: Not on file  . Years of education: Not on file  . Highest education level: Not on file  Occupational History  . Not on file  Social Needs  . Financial resource strain: Not on file  . Food insecurity:    Worry: Not on file    Inability: Not on file  . Transportation needs:    Medical: Not on file    Non-medical: Not on file  Tobacco Use  . Smoking status: Never Smoker  . Smokeless tobacco: Never Used  Substance and Sexual Activity  . Alcohol use: Yes    Comment:  occ  . Drug use: No  . Sexual activity: Yes    Birth control/protection: Pill  Lifestyle  . Physical activity:    Days per week: Not on file    Minutes per session: Not on file  . Stress: Not on file  Relationships  . Social connections:    Talks on phone: Not on file    Gets together: Not on file    Attends religious service: Not on file    Active member of club or organization: Not on file    Attends meetings of clubs or organizations: Not on file    Relationship status: Not on file  . Intimate partner violence:    Fear of current or ex partner: Not on file    Emotionally abused: Not on file    Physically abused: Not on file    Forced sexual activity: Not on file  Other Topics Concern  . Not on file  Social History Narrative  . Not  on file    Allergies:  No Known Allergies  Medications: Prior to Admission medications   Medication Sig Start Date End Date Taking? Authorizing Provider  acetaminophen (TYLENOL) 500 MG tablet Take 250-500 mg by mouth every 6 (six) hours as needed (for pain.).   Yes [provider]  albuterol (PROVENTIL HFA;VENTOLIN HFA) 108 (90 Base) MCG/ACT inhaler Inhale 1-2 puffs into the lungs every 6 (six) hours as needed for wheezing or shortness of breath.   Yes [provider]  clindamycin-benzoyl peroxide (BENZACLIN) gel  09/12/18  Yes [provider]  escitalopram (LEXAPRO) 10 MG tablet  09/27/18  Yes [provider]  XARELTO 20 MG TABS tablet Take 1 tablet (20 mg total) by mouth daily with supper. 01/02/18  Yes Creig Hines, MD  norethindrone (AYGESTIN) 5 MG tablet Take 1 tablet (5 mg total) by mouth daily. 10/12/18   Vena Austria, MD  Safflower Oil (TONALIN CLA) 1000 MG CAPS Take by mouth 2 (two) times daily.    [provider]    Physical Exam Blood pressure 106/70, pulse 82, height 4\' 11"  (1.499 m), weight 135 lb (61.2 kg), last menstrual period 09/24/2018.  Patient's last menstrual period was 09/24/2018 (exact date).  General: NAD HEENT: normocephalic, anicteric Pulmonary: No increased work of breathing Cardiovascular: RRR, distal pulses 2+ Extremities: no edema, erythema, or tenderness Neurologic: Grossly intact Psychiatric: mood appropriate, affect full  Female chaperone present for pelvic portions of the physical exam    Assessment: 27 y.o. G0P0000 with abnormal uterine bleeding  Plan: Problem List Items Addressed This Visit    None    Visit Diagnoses    Abnormal uterine bleeding    -  Primary   Dysmenorrhea          1) Discussed management options for abnormal uterine bleeding including expectant, NSAIDs, tranexamic acid (Lysteda), oral progesterone (Provera, norethindrone, megace), Depo Provera, Levonorgestrel  containing IUD, endometrial ablation (Novasure) or hysterectomy as definitive surgical management.  The patient is interested in future fertility.  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  The only category I contraceptive option which would not address regulation her menstrual cycles and may in fact worsen menstrual flow is the copper IUD.  All other progestin only option are category 2.  While Depo-Provera is listed as category 2 in this setting, compared to other progestin only options there is  data suggesting and up to 2 fold increase  risk of DVT/PE with it use ACOG Committee Opinion No 202 "Use of Hormonal Contraception in Women with Co-exisiting Medical Conditions" February 2019.  At this point the patient opts for increased dose of norethindrone   2) Return in about 3 months (around 01/12/2019) for 2-3 months medication follow up.   Vena Austria, MD, [facility], [facility] 10/16/2018, 10:32 AM     Discussed higher dose progestin only Depo provera (slight increase DVT risk) Nexplanon (issues with cycle control) paragard Kyleena   Still anticoagulated

## 2019-01-25 ENCOUNTER — Ambulatory Visit: Payer: BLUE CROSS/BLUE SHIELD | Admitting: Obstetrics and Gynecology

## 2019-02-05 ENCOUNTER — Telehealth: Payer: Self-pay

## 2019-02-05 NOTE — Telephone Encounter (Signed)
Pt has appt 02/22/19 for bc f/u.  Instead, can she have the liletta IUD placed?  [phone]

## 2019-02-05 NOTE — Telephone Encounter (Signed)
Yes as long as she has continued on the norethindrone that should be fine.  If she discontinued norethindrone she'd have to wait for her first cycle

## 2019-02-05 NOTE — Telephone Encounter (Signed)
Advise

## 2019-02-06 NOTE — Telephone Encounter (Signed)
Pt states she has not stopped the pills. Aware an order will be placed for the Liletta. Advised to keep her appointment in [place] with AMS.   Message forwarded to Crystal to reserve a Liletta for patient. Appointment is 3/20

## 2019-02-22 ENCOUNTER — Ambulatory Visit: Payer: BLUE CROSS/BLUE SHIELD | Admitting: Obstetrics and Gynecology

## 2019-02-22 ENCOUNTER — Other Ambulatory Visit: Payer: Self-pay | Admitting: Obstetrics and Gynecology

## 2019-02-22 MED ORDER — NORETHINDRONE ACETATE 5 MG PO TABS: 2.5000 mg | ORAL_TABLET | Freq: Every day | ORAL | 11 refills | Status: DC

## 2019-03-05 NOTE — Telephone Encounter (Signed)
Appointment cancelled d/t COVID-19 until further notice.

## 2023-09-21 ENCOUNTER — Encounter: Payer: BLUE CROSS/BLUE SHIELD | Admitting: Oncology

## 2023-10-12 ENCOUNTER — Inpatient Hospital Stay: Payer: Managed Care, Other (non HMO)

## 2023-10-12 ENCOUNTER — Inpatient Hospital Stay: Payer: Managed Care, Other (non HMO) | Attending: Oncology | Admitting: Internal Medicine

## 2023-10-12 ENCOUNTER — Encounter: Payer: Self-pay | Admitting: Internal Medicine

## 2023-10-12 VITALS — BP 90/61 | HR 73 | Temp 98.4°F | Wt 158.0 lb

## 2023-10-12 DIAGNOSIS — Z7901 Long term (current) use of anticoagulants: Secondary | ICD-10-CM | POA: Insufficient documentation

## 2023-10-12 DIAGNOSIS — Z86711 Personal history of pulmonary embolism: Secondary | ICD-10-CM | POA: Diagnosis present

## 2023-10-12 DIAGNOSIS — I2699 Other pulmonary embolism without acute cor pulmonale: Secondary | ICD-10-CM

## 2023-10-12 DIAGNOSIS — I82422 Acute embolism and thrombosis of left iliac vein: Secondary | ICD-10-CM

## 2023-10-12 DIAGNOSIS — Z809 Family history of malignant neoplasm, unspecified: Secondary | ICD-10-CM | POA: Diagnosis not present

## 2023-10-12 DIAGNOSIS — Z86718 Personal history of other venous thrombosis and embolism: Secondary | ICD-10-CM | POA: Insufficient documentation

## 2023-10-12 MED ORDER — ASPIRIN 81 MG PO TBEC
81.0000 mg | DELAYED_RELEASE_TABLET | Freq: Every day | ORAL | 12 refills | Status: AC
Start: 2023-10-12 — End: ?

## 2023-10-12 NOTE — Progress Notes (Signed)
[facility]  [phone] [phone]  ID: Gabrielle Parrish OB: [birth year]  MR#: [id number]  ZDG#:[id number]  Patient Care Team: Center, [facility] as PCP - General (General Practice) Michaelyn Barter, MD as Consulting Physician (Oncology)  REFERRING PROVIDER: Dr. Hillery Aldo, MD  REASON FOR REFERRAL: history of   HPI: Gabrielle Parrish is a 32 y.o. female with past medical history of LLE DVT, left-sided PE and May Thurner syndrome presented to [facility] ED in December 2018  diagnosed with acute left DVT involving external iliac, common femoral and profunda femoralis vein with evidence of thrombus extension more proximally.  CT venogram showed acute large venous clot extending from infrarenal IVC distally into the left common iliac and left femoral vein.  CTA chest also showed PE in the distal left lower lobar artery and few segmental branches. Started on rivaroxaban.  She was also on OCP which was discontinued.  She was evaluated by Dr. Smith Robert in January 2019.  APLA panel, factor V and prothrombin mutation was negative.  Due to continued lower extremity symptoms she was seen by Dr. Asencion Islam and underwent thrombectomy and venoplasty with iliac stent placement on 12/08/2017.  IVC filter was also placed with plan to remove in 3 months.  Patient was lost to follow-up.  Then saw Dr. Harlow Ohms, hematologist at [facility].  Remainder of thrombophilia workup protein C/protein S Antithrombin III done at [facility] was normal.  Was recommended about 6 months of anticoagulation and removal of IVC filter with follow-up.  Saw Dr. Jacolyn Reedy vascular at [facility] s/p recannulization of the left iliac vein stent with additional stent placement and IVC filter removal.  Again patient was lost to follow-up with hematology at [facility].  She continues to be on Xarelto since 2019.  Seen today accompanied by mother.  Denies any residual left leg symptoms.  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  REVIEW OF SYSTEMS:   ROS  As per HPI. Otherwise, a complete review of systems is negative.  PAST MEDICAL HISTORY: Past Medical History:  Diagnosis Date   Asthma    Clotting disorder (HCC)     PAST SURGICAL HISTORY: Past Surgical History:  Procedure Laterality Date   IVC FILTER INSERTION Left 12/08/2017   Procedure: IVC FILTER INSERTION;  Surgeon: Renford Dills, MD;  Location: [facility] INVASIVE CV LAB;  Service: Cardiovascular;  Laterality: Left;   PERIPHERAL VASCULAR THROMBECTOMY Left 12/08/2017   Procedure: PERIPHERAL VASCULAR THROMBECTOMY;  Surgeon: Renford Dills, MD;  Location: [facility] INVASIVE CV LAB;  Service: Cardiovascular;  Laterality: Left;    FAMILY HISTORY: Family History  Problem Relation Age of Onset   Healthy Mother    Healthy Father    Cancer Maternal Grandmother    Cancer Other     HEALTH MAINTENANCE: Social History   Tobacco Use   Smoking status: Never   Smokeless tobacco: Never  Vaping Use   Vaping status: Never Used  Substance Use Topics   Alcohol use: Yes    Comment: occ   Drug use: No     No Known Allergies  Current Outpatient Medications  Medication Sig Dispense Refill   acetaminophen (TYLENOL) 500 MG tablet Take 250-500 mg by mouth every 6 (six) hours as needed (for pain.).     albuterol (PROVENTIL HFA;VENTOLIN HFA) 108 (90 Base) MCG/ACT inhaler Inhale 1-2 puffs into the lungs every 6 (six) hours as needed for wheezing or shortness of breath.     clindamycin-benzoyl peroxide (BENZACLIN) gel  0   escitalopram (LEXAPRO) 10 MG tablet      norethindrone (AYGESTIN) 5 MG tablet Take 0.5 tablets (2.5 mg total) by mouth daily. 30 tablet 11   Safflower Oil (TONALIN CLA) 1000 MG CAPS Take by mouth 2 (two) times daily.     XARELTO 20 MG TABS tablet Take 1 tablet (20 mg total) by mouth daily with supper. 30 tablet 4   No current facility-administered medications for this visit.    OBJECTIVE: There were no vitals filed for this  visit.   There is no height or weight on file to calculate BMI.      General: Well-developed, well-nourished, no acute distress. Eyes: Pink conjunctiva, anicteric sclera. HEENT: Normocephalic, moist mucous membranes, clear oropharnyx. Lungs: Clear to auscultation bilaterally. Heart: Regular rate and rhythm. No rubs, murmurs, or gallops. Abdomen: Soft, nontender, nondistended. No organomegaly noted, normoactive bowel sounds. Musculoskeletal: No edema, cyanosis, or clubbing. Neuro: Alert, answering all questions appropriately. Cranial nerves grossly intact. Skin: No rashes or petechiae noted. Psych: Normal affect. Lymphatics: No cervical, calvicular, axillary or inguinal LAD.   LAB RESULTS:  Lab Results  Component Value Date   NA 135 01/02/2018   K 3.7 01/02/2018   CL 103 01/02/2018   CO2 24 01/02/2018   GLUCOSE 83 01/02/2018   BUN 8 01/02/2018   CREATININE 0.66 01/02/2018   CALCIUM 9.1 01/02/2018   PROT 7.7 01/02/2018   ALBUMIN 4.4 01/02/2018   AST 23 01/02/2018   ALT 12 (L) 01/02/2018   ALKPHOS 54 01/02/2018   BILITOT 0.8 01/02/2018   GFRNONAA >60 01/02/2018   GFRAA >60 01/02/2018    Lab Results  Component Value Date   WBC 6.9 01/02/2018   NEUTROABS 3.8 01/02/2018   HGB 11.9 (L) 01/02/2018   HCT 35.8 01/02/2018   MCV 85.2 01/02/2018   PLT 253 01/02/2018    No results found for: "TIBC", "FERRITIN", "IRONPCTSAT"   STUDIES: No results found.  ASSESSMENT AND PLAN:   Gabrielle Parrish is a 32 y.o. 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  CT venogram showed acute large venous clot extending from infrarenal IVC distally into the left common iliac and left femoral vein.  # Left lung PE # Left lower extremity DVT involving external iliac, common femoral  and profundofemoralis # Possible May Thurner syndrome -Event diagnosed in 2019.  Provoking factors include OCP and possible May Thurner syndrome.  - CTA chest also showed PE in the distal left lower lobar artery and few segmental branches. Started on rivaroxaban.  She was also on OCP which was discontinued.  She was evaluated by Dr. Smith Robert in January 2019.  APLA panel, factor V and prothrombin mutation was negative. Due to continued lower extremity symptoms she was seen by Dr. Asencion Islam and underwent thrombectomy and venoplasty with iliac stent placement on 12/08/2017.  IVC filter was also placed with plan to remove in 3 months.  Patient was lost to follow-up.  - Then saw Dr. Harlow Ohms, hematologist at [facility].  Remainder of thrombophilia workup protein C/protein S Antithrombin III done at [facility] was normal.  Was recommended about 6 months of anticoagulation and removal of IVC filter with follow-up.  Saw Dr. Jacolyn Reedy vascular at [facility] s/p recannulization of the left iliac vein stent with additional stent placement and IVC filter removal.  Again patient was lost to follow-up with hematology at [facility].  She continues to be on Xarelto since 2019.  -Discussed with the patient and her mother that her hypercoag workup has been negative.  She is currently on progesterone only pill.  We discussed about discontinuing Xarelto.  Transition to baby aspirin 81 mg once daily indefinitely with her history of stents.  She is scheduled for upper endoscopy on 11/15.  I think it should be okay to proceed with low dose aspirin.  I have advised her to discuss with GI and let us know if there are any concerns.  -In future, if she decides for pregnancy she will need prophylactic Lovenox antepartum and 6 weeks postpartum. -Discussed about using compression stockings, staying well-hydrated and frequent ambulation during long flights and car rides. -I will follow-up with her again in 6 months and if everything is stable she can be discharged from  the clinic.  RTC in 6 months for MD visit, labs  Patient expressed understanding and was in agreement with this plan. She also understands that She can call clinic at any time with any questions, concerns, or complaints.   I spent a total of 45 minutes reviewing chart data, face-to-face evaluation with the patient, counseling and coordination of care as detailed above.  Michaelyn Barter, MD   10/12/2023 2:01 PM

## 2023-10-12 NOTE — Patient Instructions (Signed)
You can go ahead and stop Xarelto today  Please start baby aspirin 81 mg once daily indefinitely   Anytime you decide for pregnancy, you will need lovenox antepartum and 6 weeks postpartum.   Avoid estrogen containing contraception.

## 2023-10-12 NOTE — Progress Notes (Signed)
The patient's PCP is Dr. Allena Katz, and he wanted have the patient get an endoscopy, which is scheduled for Friday 10/20/2023, but they want clearance from [place] first. She is also wanting to see if she should keep taking her Zarelto or just start taking a baby Asprin instead.

## 2024-04-11 ENCOUNTER — Inpatient Hospital Stay: Payer: Managed Care, Other (non HMO)

## 2024-04-11 ENCOUNTER — Inpatient Hospital Stay: Payer: Managed Care, Other (non HMO) | Admitting: Internal Medicine

## 2024-05-15 NOTE — Progress Notes (Signed)
 PCP:  Center, [facility]   Chief Complaint  Patient presents with   Gynecologic Exam    No concerns     HPI:      Ms. Gabrielle Parrish is a 33 y.o. [id number] whose LMP was Patient's last menstrual period was 05/01/2024 (exact date)., presents today for her NP annual examination.  Her menses are regular every 28-30 days, lasting 3-5 days, light flow on POPs; was heavier when on xarelto .  Usually no BTB but did have some last pill pack. Stopped POPs ~ 1 month ago and had menses, lasting 4 days, light flow, mild dysmen, improved with tylenol  prn. HX OF DVT/PE; hx of May-Thurner sydnrome  Sex activity: single partner, contraception - none. Conception ok, not taking PNVs. On daily ASA now. No pain/bleeding/dryness.  Last Pap: 01/12/18 Results were: ASCUS/neg HPV DNA; no recent paps  There is a FH of breast cancer in her MGM, genetic testing not indicated. There is no FH of ovarian cancer. The patient does not do self-breast exams.  Tobacco use: The patient denies current or previous tobacco use. Alcohol use: none No drug use.  Exercise: moderately active  She does get adequate calcium but not Vitamin D in her diet. Hx of Vit D deficiency in past and did Rx Vit D, no OTC supp since completed Rx.   Patient Active Problem List   Diagnosis Date Noted   May-Thurner syndrome 01/07/2018   DVT (deep venous thrombosis) (HCC) 12/08/2017   Acute pulmonary embolism (HCC) 12/08/2017   Asthma 12/08/2017   Left leg DVT (HCC) 12/08/2017    Past Surgical History:  Procedure Laterality Date   IVC FILTER INSERTION Left 12/08/2017   Procedure: IVC FILTER INSERTION;  Surgeon: Jackquelyn Mass, MD;  Location: [facility] INVASIVE CV LAB;  Service: Cardiovascular;  Laterality: Left;   PERIPHERAL VASCULAR THROMBECTOMY Left 12/08/2017   Procedure: PERIPHERAL VASCULAR THROMBECTOMY;  Surgeon: Jackquelyn Mass, MD;  Location: [facility] INVASIVE CV LAB;  Service: Cardiovascular;  Laterality: Left;     Family History  Problem Relation Age of Onset   Healthy Mother    Healthy Father    Breast cancer Maternal Grandmother        late 40s/early 8s   Cancer Maternal Great-grandmother        possibly Kidney Cancer    Social History   Socioeconomic History   Marital status: Single    Spouse name: Not on file   Number of children: Not on file   Years of education: Not on file   Highest education level: Not on file  Occupational History   Not on file  Tobacco Use   Smoking status: Never   Smokeless tobacco: Never  Vaping Use   Vaping status: Never Used  Substance and Sexual Activity   Alcohol use: Yes    Comment: occ   Drug use: No   Sexual activity: Yes    Birth control/protection: None  Other Topics Concern   Not on file  Social History Narrative   Not on file   Social Drivers of Health   Financial Resource Strain: Not on file  Food Insecurity: No Food Insecurity (10/12/2023)   Hunger Vital Sign    Worried About Running Out of Food in the Last Year: Never true    Ran Out of Food in the Last Year: Never true  Transportation Needs: No Transportation Needs (10/12/2023)   PRAPARE - Administrator, Civil Service (Medical): No  Lack of Transportation (Non-Medical): No  Physical Activity: Unknown (01/10/2019)   Received from [facility]   Exercise Vital Sign    Days of Exercise per Week: 0 days    Minutes of Exercise per Session: Not on file  Stress: Stress Concern Present (01/10/2019)   Received from [facility] - Occupational Stress Questionnaire    Feeling of Stress : Very much  Social Connections: Unknown (01/10/2019)   Received from [facility]   Social Connection and Isolation Panel    Frequency of Communication with Friends and Family: Twice a week    Frequency of Social Gatherings with Friends and Family: Once a week    Attends Religious Services: More than 4 times per year    Active Member of  [employer] or Organizations: No    Attends Banker Meetings: Never    Marital Status: Not on file  Intimate Partner Violence: Not At Risk (10/12/2023)   Humiliation, Afraid, Rape, and Kick questionnaire    Fear of Current or Ex-Partner: No    Emotionally Abused: No    Physically Abused: No    Sexually Abused: No     Current Outpatient Medications:    acetaminophen  (TYLENOL ) 500 MG tablet, Take 250-500 mg by mouth every 6 (six) hours as needed (for pain.)., Disp: , Rfl:    albuterol  (PROVENTIL  HFA;VENTOLIN  HFA) 108 (90 Base) MCG/ACT inhaler, Inhale 1-2 puffs into the lungs every 6 (six) hours as needed for wheezing or shortness of breath., Disp: , Rfl:    ascorbic acid (VITAMIN C) 500 MG tablet, VITAMIN C 500 MG TABS, Disp: , Rfl:    aspirin  EC 81 MG tablet, Take 1 tablet (81 mg total) by mouth daily. Swallow whole., Disp: 30 tablet, Rfl: 12   buPROPion (WELLBUTRIN XL) 150 MG 24 hr tablet, Take by mouth., Disp: , Rfl:    cetirizine (ZYRTEC) 10 MG tablet, Take by mouth., Disp: , Rfl:    esomeprazole (NEXIUM) 40 MG capsule, Take by mouth., Disp: , Rfl:    ferrous sulfate 325 (65 FE) MG tablet, FERROUS SULFATE 325 (65 Fe) MG TABS, Disp: , Rfl:    fluticasone (FLONASE) 50 MCG/ACT nasal spray, , Disp: , Rfl:    fluticasone (FLOVENT HFA) 110 MCG/ACT inhaler, Flovent HFA 110 mcg/actuation HFA aerosol inhaler, Disp: , Rfl:    Insulin Pen Needle (TRUEPLUS 5-BEVEL PEN NEEDLES) 31G X 6 MM MISC, TRUEPLUS 5-BEVEL PEN NEEDLES 31G X 6 MM, Disp: , Rfl:    meclizine (ANTIVERT) 25 MG tablet, MECLIZINE HCL 25 MG TABS, Disp: , Rfl:    norethindrone  (MICRONOR ) 0.35 MG tablet, Take by mouth., Disp: , Rfl:    ondansetron  (ZOFRAN ) 4 MG tablet, ONDANSETRON  HCL 4 MG TABS, Disp: , Rfl:    Safflower Oil (TONALIN CLA) 1000 MG CAPS, Take by mouth 2 (two) times daily., Disp: , Rfl:    traZODone (DESYREL) 50 MG tablet, TRAZODONE HCL 50 MG TABS, Disp: , Rfl:    VICTOZA 18 MG/3ML SOPN, Inject into the skin., Disp:  , Rfl:    Semaglutide-Weight Management (WEGOVY) 0.5 MG/0.5ML SOAJ, WEGOVY 0.5 MG/0.5ML SOAJ (Patient not taking: Reported on 05/16/2024), Disp: , Rfl:      ROS:  Review of Systems  Constitutional:  Negative for fatigue, fever and unexpected weight change.  Respiratory:  Negative for cough, shortness of breath and wheezing.   Cardiovascular:  Negative for chest pain, palpitations and leg swelling.  Gastrointestinal:  Negative  for blood in stool, constipation, diarrhea, nausea and vomiting.  Endocrine: Negative for cold intolerance, heat intolerance and polyuria.  Genitourinary:  Negative for dyspareunia, dysuria, flank pain, frequency, genital sores, hematuria, menstrual problem, pelvic pain, urgency, vaginal bleeding, vaginal discharge and vaginal pain.  Musculoskeletal:  Negative for back pain, joint swelling and myalgias.  Skin:  Negative for rash.  Neurological:  Positive for headaches. Negative for dizziness, syncope, light-headedness and numbness.  Hematological:  Negative for adenopathy.  Psychiatric/Behavioral:  Negative for agitation, confusion, sleep disturbance and suicidal ideas. The patient is not nervous/anxious.    BREAST: No symptoms   Objective: BP 97/67   Pulse 86   Ht 4' 10 (1.473 m)   Wt 149 lb (67.6 kg)   LMP 05/01/2024 (Exact Date)   BMI 31.14 kg/m    Physical Exam Constitutional:      Appearance: She is well-developed.  Genitourinary:     Vulva normal.     Right Labia: No rash, tenderness or lesions.    Left Labia: No tenderness, lesions or rash.    No vaginal discharge, erythema or tenderness.      Right Adnexa: not tender and no mass present.    Left Adnexa: not tender and no mass present.    No cervical friability or polyp.     Uterus is not enlarged or tender.  Breasts:    Right: No mass, nipple discharge, skin change or tenderness.     Left: No mass, nipple discharge, skin change or tenderness.  Neck:     Thyroid: No thyromegaly.    Cardiovascular:     Rate and Rhythm: Normal rate and regular rhythm.     Heart sounds: Normal heart sounds. No murmur heard. Pulmonary:     Effort: Pulmonary effort is normal.     Breath sounds: Normal breath sounds.  Abdominal:     Palpations: Abdomen is soft.     Tenderness: There is no abdominal tenderness. There is no guarding or rebound.   Musculoskeletal:        General: Normal range of motion.     Cervical back: Normal range of motion.  Lymphadenopathy:     Cervical: No cervical adenopathy.   Neurological:     General: No focal deficit present.     Mental Status: She is alert and oriented to person, place, and time.     Cranial Nerves: No cranial nerve deficit.   Skin:    General: Skin is warm and dry.   Psychiatric:        Mood and Affect: Mood normal.        Behavior: Behavior normal.        Thought Content: Thought content normal.        Judgment: Judgment normal.  Vitals reviewed.     Assessment/Plan: Encounter for annual routine gynecological examination  Cervical cancer screening - Plan: Cytology - PAP  Screening for HPV (human papillomavirus) - Plan: Cytology - PAP  Pre-conception counseling--pt is high risk due to hx of DVT/PE and May-Thurner syndrome. Pt to reach out to hematology re: any addl mgmt prior to conception or just continue ASA. Aware she will need to be followed by them with pregnancy.             GYN counsel adequate intake of calcium and vitamin D, diet and exercise     F/U  Return in about 1 year (around 05/16/2025).  Alley Neils B. Iwao Shamblin, PA-C 05/16/2024 10:05 AM

## 2024-05-16 ENCOUNTER — Encounter: Payer: Self-pay | Admitting: Obstetrics and Gynecology

## 2024-05-16 ENCOUNTER — Ambulatory Visit: Admitting: Obstetrics and Gynecology

## 2024-05-16 ENCOUNTER — Other Ambulatory Visit (HOSPITAL_COMMUNITY)
Admission: RE | Admit: 2024-05-16 | Discharge: 2024-05-16 | Disposition: A | Discharge status: Home / Self Care | Source: Ambulatory Visit | Attending: Obstetrics and Gynecology | Admitting: Obstetrics and Gynecology

## 2024-05-16 VITALS — BP 97/67 | HR 86 | Ht <= 58 in | Wt 149.0 lb

## 2024-05-16 DIAGNOSIS — Z3169 Encounter for other general counseling and advice on procreation: Secondary | ICD-10-CM

## 2024-05-16 DIAGNOSIS — Z124 Encounter for screening for malignant neoplasm of cervix: Secondary | ICD-10-CM | POA: Diagnosis present

## 2024-05-16 DIAGNOSIS — Z1151 Encounter for screening for human papillomavirus (HPV): Secondary | ICD-10-CM | POA: Diagnosis present

## 2024-05-16 DIAGNOSIS — Z01419 Encounter for gynecological examination (general) (routine) without abnormal findings: Secondary | ICD-10-CM | POA: Diagnosis not present

## 2024-05-16 NOTE — Patient Instructions (Signed)
 I value your feedback and you entrusting [place] with your care. If you get a [place] patient survey, I would appreciate you taking the time to let us know about your experience today. Thank you! ? ? ?

## 2024-05-17 LAB — CYTOLOGY - PAP
Adequacy: ABSENT
Diagnosis: NEGATIVE
